# Patient Record
Sex: Male | Born: 1985
Health system: Southern US, Community
[De-identification: ages and names within clinical notes are randomized; demographics above are authoritative.]

---

## 2010-11-09 ENCOUNTER — Emergency Department: Payer: Self-pay | Admitting: Emergency Medicine

## 2014-06-23 ENCOUNTER — Ambulatory Visit: Payer: Self-pay | Admitting: General Practice

## 2015-10-06 ENCOUNTER — Other Ambulatory Visit: Payer: Self-pay | Admitting: Neurosurgery

## 2015-10-06 DIAGNOSIS — Q0700 Arnold-Chiari syndrome without spina bifida or hydrocephalus: Secondary | ICD-10-CM

## 2015-10-06 DIAGNOSIS — G950 Syringomyelia and syringobulbia: Secondary | ICD-10-CM

## 2015-10-15 ENCOUNTER — Ambulatory Visit
Admission: RE | Admit: 2015-10-15 | Discharge: 2015-10-15 | Disposition: A | Discharge status: Home / Self Care | Payer: 59 | Source: Ambulatory Visit | Attending: Neurosurgery | Admitting: Neurosurgery

## 2015-10-15 DIAGNOSIS — G950 Syringomyelia and syringobulbia: Secondary | ICD-10-CM | POA: Insufficient documentation

## 2015-10-15 DIAGNOSIS — Q0700 Arnold-Chiari syndrome without spina bifida or hydrocephalus: Secondary | ICD-10-CM | POA: Insufficient documentation

## 2015-10-15 DIAGNOSIS — G935 Compression of brain: Secondary | ICD-10-CM | POA: Diagnosis not present

## 2015-10-15 MED ORDER — GADOBENATE DIMEGLUMINE 529 MG/ML IV SOLN
20.0000 mL | Freq: Once | INTRAVENOUS | Status: AC | PRN
  Administered 2015-10-15: 20 mL via INTRAVENOUS

## 2015-10-16 ENCOUNTER — Other Ambulatory Visit: Payer: Self-pay

## 2015-10-17 ENCOUNTER — Ambulatory Visit
Admission: RE | Admit: 2015-10-17 | Discharge: 2015-10-17 | Disposition: A | Discharge status: Home / Self Care | Payer: 59 | Source: Ambulatory Visit | Attending: Neurosurgery | Admitting: Neurosurgery

## 2015-10-17 DIAGNOSIS — M4806 Spinal stenosis, lumbar region: Secondary | ICD-10-CM | POA: Diagnosis not present

## 2015-10-17 DIAGNOSIS — M5126 Other intervertebral disc displacement, lumbar region: Secondary | ICD-10-CM | POA: Insufficient documentation

## 2015-10-17 DIAGNOSIS — Q0700 Arnold-Chiari syndrome without spina bifida or hydrocephalus: Secondary | ICD-10-CM | POA: Diagnosis present

## 2015-10-17 DIAGNOSIS — G950 Syringomyelia and syringobulbia: Secondary | ICD-10-CM | POA: Insufficient documentation

## 2015-10-17 MED ORDER — GADOBENATE DIMEGLUMINE 529 MG/ML IV SOLN
20.0000 mL | Freq: Once | INTRAVENOUS | Status: AC | PRN
  Administered 2015-10-17: 20 mL via INTRAVENOUS

## 2016-01-08 DIAGNOSIS — Z13220 Encounter for screening for lipoid disorders: Secondary | ICD-10-CM | POA: Diagnosis not present

## 2016-01-08 DIAGNOSIS — G935 Compression of brain: Secondary | ICD-10-CM | POA: Diagnosis not present

## 2016-01-08 DIAGNOSIS — Z23 Encounter for immunization: Secondary | ICD-10-CM | POA: Diagnosis not present

## 2016-01-08 DIAGNOSIS — Z6830 Body mass index (BMI) 30.0-30.9, adult: Secondary | ICD-10-CM | POA: Diagnosis not present

## 2016-01-08 DIAGNOSIS — E669 Obesity, unspecified: Secondary | ICD-10-CM | POA: Diagnosis not present

## 2016-01-08 DIAGNOSIS — Z0000 Encounter for general adult medical examination without abnormal findings: Secondary | ICD-10-CM | POA: Diagnosis not present

## 2016-03-04 DIAGNOSIS — H52223 Regular astigmatism, bilateral: Secondary | ICD-10-CM | POA: Diagnosis not present

## 2018-06-11 ENCOUNTER — Encounter: Payer: Self-pay | Admitting: Adult Health

## 2018-06-11 ENCOUNTER — Ambulatory Visit (INDEPENDENT_AMBULATORY_CARE_PROVIDER_SITE_OTHER): Payer: 59 | Admitting: Adult Health

## 2018-06-11 DIAGNOSIS — Z0000 Encounter for general adult medical examination without abnormal findings: Secondary | ICD-10-CM | POA: Diagnosis not present

## 2018-06-11 DIAGNOSIS — I820 Budd-Chiari syndrome: Secondary | ICD-10-CM | POA: Diagnosis not present

## 2018-06-11 NOTE — Patient Instructions (Addendum)
Preventive Care for Adults, Male A healthy lifestyle and preventive care can promote health and wellness. Preventive health guidelines for men include the following key practices:  A routine yearly physical is a good way to check with your health care provider about your health and preventative screening. It is a chance to share any concerns and updates on your health and to receive a thorough exam.  Visit your dentist for a routine exam and preventative care every 6 months. Brush your teeth twice a day and floss once a day. Good oral hygiene prevents tooth decay and gum disease.  The frequency of eye exams is based on your age, health, family medical history, use of contact lenses, and other factors. Follow your health care provider's recommendations for frequency of eye exams.  Eat a healthy diet. Foods such as vegetables, fruits, whole grains, low-fat dairy products, and lean protein foods contain the nutrients you need without too many calories. Decrease your intake of foods high in solid fats, added sugars, and salt. Eat the right amount of calories for you.Get information about a proper diet from your health care provider, if necessary.  Regular physical exercise is one of the most important things you can do for your health. Most adults should get at least 150 minutes of moderate-intensity exercise (any activity that increases your heart rate and causes you to sweat) each week. In addition, most adults need muscle-strengthening exercises on 2 or more days a week.  Maintain a healthy weight. The body mass index (BMI) is a screening tool to identify possible weight problems. It provides an estimate of body fat based on height and weight. Your health care provider can find your BMI and can help you achieve or maintain a healthy weight.For adults 20 years and older:  A BMI below 18.5 is considered underweight.  A BMI of 18.5 to 24.9 is normal.  A BMI of 25 to 29.9 is considered  overweight.  A BMI of 30 and above is considered obese.  Maintain normal blood lipids and cholesterol levels by exercising and minimizing your intake of saturated fat. Eat a balanced diet with plenty of fruit and vegetables. Blood tests for lipids and cholesterol should begin at age 55 and be repeated every 5 years. If your lipid or cholesterol levels are high, you are over 50, or you are at high risk for heart disease, you may need your cholesterol levels checked more frequently.Ongoing high lipid and cholesterol levels should be treated with medicines if diet and exercise are not working.  If you smoke, find out from your health care provider how to quit. If you do not use tobacco, do not start.  Lung cancer screening is recommended for adults aged 90-80 years who are at high risk for developing lung cancer because of a history of smoking. A yearly low-dose CT scan of the lungs is recommended for people who have at least a 30-pack-year history of smoking and are a current smoker or have quit within the past 15 years. A pack year of smoking is smoking an average of 1 pack of cigarettes a day for 1 year (for example: 1 pack a day for 30 years or 2 packs a day for 15 years). Yearly screening should continue until the smoker has stopped smoking for at least 15 years. Yearly screening should be stopped for people who develop a health problem that would prevent them from having lung cancer treatment.  If you choose to drink alcohol, do not have more  than 2 drinks per day. One drink is considered to be 12 ounces (355 mL) of beer, 5 ounces (148 mL) of wine, or 1.5 ounces (44 mL) of liquor.  Avoid use of street drugs. Do not share needles with anyone. Ask for help if you need support or instructions about stopping the use of drugs.  High blood pressure causes heart disease and increases the risk of stroke. Your blood pressure should be checked at least every 1-2 years. Ongoing high blood pressure should be  treated with medicines, if weight loss and exercise are not effective.  If you are 34-90 years old, ask your health care provider if you should take aspirin to prevent heart disease.  Diabetes screening is done by taking a blood sample to check your blood glucose level after you have not eaten for a certain period of time (fasting). If you are not overweight and you do not have risk factors for diabetes, you should be screened once every 3 years starting at age 35. If you are overweight or obese and you are 70-84 years of age, you should be screened for diabetes every year as part of your cardiovascular risk assessment.  Colorectal cancer can be detected and often prevented. Most routine colorectal cancer screening begins at the age of 18 and continues through age 69. However, your health care provider may recommend screening at an earlier age if you have risk factors for colon cancer. On a yearly basis, your health care provider may provide home test kits to check for hidden blood in the stool. Use of a small camera at the end of a tube to directly examine the colon (sigmoidoscopy or colonoscopy) can detect the earliest forms of colorectal cancer. Talk to your health care provider about this at age 71, when routine screening begins. Direct exam of the colon should be repeated every 5-10 years through age 18, unless early forms of precancerous polyps or small growths are found.  People who are at an increased risk for hepatitis B should be screened for this virus. You are considered at high risk for hepatitis B if:  You were born in a country where hepatitis B occurs often. Talk with your health care provider about which countries are considered high risk.  Your parents were born in a high-risk country and you have not received a shot to protect against hepatitis B (hepatitis B vaccine).  You have HIV or AIDS.  You use needles to inject street drugs.  You live with, or have sex with, someone who  has hepatitis B.  You are a man who has sex with other men (MSM).  You get hemodialysis treatment.  You take certain medicines for conditions such as cancer, organ transplantation, and autoimmune conditions.  Hepatitis C blood testing is recommended for all people born from [birth year] through 1965 and any individual with known risks for hepatitis C.  Practice safe sex. Use condoms and avoid high-risk sexual practices to reduce the spread of sexually transmitted infections (STIs). STIs include gonorrhea, chlamydia, syphilis, trichomonas, herpes, HPV, and human immunodeficiency virus (HIV). Herpes, HIV, and HPV are viral illnesses that have no cure. They can result in disability, cancer, and death.  If you are a man who has sex with other men, you should be screened at least once per year for:  HIV.  Urethral, rectal, and pharyngeal infection of gonorrhea, chlamydia, or both.  If you are at risk of being infected with HIV, it is recommended that you take a  prescription medicine daily to prevent HIV infection. This is called preexposure prophylaxis (PrEP). You are considered at risk if:  You are a man who has sex with other men (MSM) and have other risk factors.  You are a heterosexual man, are sexually active, and are at increased risk for HIV infection.  You take drugs by injection.  You are sexually active with a partner who has HIV.  Talk with your health care provider about whether you are at high risk of being infected with HIV. If you choose to begin PrEP, you should first be tested for HIV. You should then be tested every 3 months for as long as you are taking PrEP.  A one-time screening for abdominal aortic aneurysm (AAA) and surgical repair of large AAAs by ultrasound are recommended for men ages 44 to 66 years who are current or former smokers.  Healthy men should no longer receive prostate-specific antigen (PSA) blood tests as part of routine cancer screening. Talk with your health  care provider about prostate cancer screening.  Testicular cancer screening is not recommended for adult males who have no symptoms. Screening includes self-exam, a health care provider exam, and other screening tests. Consult with your health care provider about any symptoms you have or any concerns you have about testicular cancer.  Use sunscreen. Apply sunscreen liberally and repeatedly throughout the day. You should seek shade when your shadow is shorter than you. Protect yourself by wearing long sleeves, pants, a wide-brimmed hat, and sunglasses year round, whenever you are outdoors.  Once a month, do a whole-body skin exam, using a mirror to look at the skin on your back. Tell your health care provider about new moles, moles that have irregular borders, moles that are larger than a pencil eraser, or moles that have changed in shape or color.  Stay current with required vaccines (immunizations).  Influenza vaccine. All adults should be immunized every year.  Tetanus, diphtheria, and acellular pertussis (Td, Tdap) vaccine. An adult who has not previously received Tdap or who does not know his vaccine status should receive 1 dose of Tdap. This initial dose should be followed by tetanus and diphtheria toxoids (Td) booster doses every 10 years. Adults with an unknown or incomplete history of completing a 3-dose immunization series with Td-containing vaccines should begin or complete a primary immunization series including a Tdap dose. Adults should receive a Td booster every 10 years.  Varicella vaccine. An adult without evidence of immunity to varicella should receive 2 doses or a second dose if he has previously received 1 dose.  Human papillomavirus (HPV) vaccine. Males aged 11-21 years who have not received the vaccine previously should receive the 3-dose series. Males aged 22-26 years may be immunized. Immunization is recommended through the age of 23 years for any male who has sex with males  and did not get any or all doses earlier. Immunization is recommended for any person with an immunocompromised condition through the age of 72 years if he did not get any or all doses earlier. During the 3-dose series, the second dose should be obtained 4-8 weeks after the first dose. The third dose should be obtained 24 weeks after the first dose and 16 weeks after the second dose.  Zoster vaccine. One dose is recommended for adults aged 23 years or older unless certain conditions are present.  Measles, mumps, and rubella (MMR) vaccine. Adults born before [birth year] generally are considered immune to measles and mumps. Adults born in [birth year]  or later should have 1 or more doses of MMR vaccine unless there is a contraindication to the vaccine or there is laboratory evidence of immunity to each of the three diseases. A routine second dose of MMR vaccine should be obtained at least 28 days after the first dose for students attending postsecondary schools, health care workers, or international travelers. People who received inactivated measles vaccine or an unknown type of measles vaccine during 1963-1967 should receive 2 doses of MMR vaccine. People who received inactivated mumps vaccine or an unknown type of mumps vaccine before 1979 and are at high risk for mumps infection should consider immunization with 2 doses of MMR vaccine. Unvaccinated health care workers born before [birth year] who lack laboratory evidence of measles, mumps, or rubella immunity or laboratory confirmation of disease should consider measles and mumps immunization with 2 doses of MMR vaccine or rubella immunization with 1 dose of MMR vaccine.  Pneumococcal 13-valent conjugate (PCV13) vaccine. When indicated, a person who is uncertain of his immunization history and has no record of immunization should receive the PCV13 vaccine. All adults 9 years of age and older should receive this vaccine. An adult aged 69 years or older who has certain medical  conditions and has not been previously immunized should receive 1 dose of PCV13 vaccine. This PCV13 should be followed with a dose of pneumococcal polysaccharide (PPSV23) vaccine. Adults who are at high risk for pneumococcal disease should obtain the PPSV23 vaccine at least 8 weeks after the dose of PCV13 vaccine. Adults older than 31 years of age who have normal immune system function should obtain the PPSV23 vaccine dose at least 1 year after the dose of PCV13 vaccine.  Pneumococcal polysaccharide (PPSV23) vaccine. When PCV13 is also indicated, PCV13 should be obtained first. All adults aged 79 years and older should be immunized. An adult younger than age 43 years who has certain medical conditions should be immunized. Any person who resides in a nursing home or long-term care facility should be immunized. An adult smoker should be immunized. People with an immunocompromised condition and certain other conditions should receive both PCV13 and PPSV23 vaccines. People with human immunodeficiency virus (HIV) infection should be immunized as soon as possible after diagnosis. Immunization during chemotherapy or radiation therapy should be avoided. Routine use of PPSV23 vaccine is not recommended for American Indians, [place] Natives, or people younger than 65 years unless there are medical conditions that require PPSV23 vaccine. When indicated, people who have unknown immunization and have no record of immunization should receive PPSV23 vaccine. One-time revaccination 5 years after the first dose of PPSV23 is recommended for people aged 19-64 years who have chronic kidney failure, nephrotic syndrome, asplenia, or immunocompromised conditions. People who received 1-2 doses of PPSV23 before age 70 years should receive another dose of PPSV23 vaccine at age 79 years or later if at least 5 years have passed since the previous dose. Doses of PPSV23 are not needed for people immunized with PPSV23 at or after age 55  years.  Meningococcal vaccine. Adults with asplenia or persistent complement component deficiencies should receive 2 doses of quadrivalent meningococcal conjugate (MenACWY-D) vaccine. The doses should be obtained at least 2 months apart. Microbiologists working with certain meningococcal bacteria, [employer] recruits, people at risk during an outbreak, and people who travel to or live in countries with a high rate of meningitis should be immunized. A first-year college student up through age 64 years who is living in a residence hall should receive a  dose if he did not receive a dose on or after his 16th birthday. Adults who have certain high-risk conditions should receive one or more doses of vaccine.  Hepatitis A vaccine. Adults who wish to be protected from this disease, have chronic liver disease, work with hepatitis A-infected animals, work in hepatitis A research labs, or travel to or work in countries with a high rate of hepatitis A should be immunized. Adults who were previously unvaccinated and who anticipate close contact with an international adoptee during the first 60 days after arrival in the [place] from a country with a high rate of hepatitis A should be immunized.  Hepatitis B vaccine. Adults should be immunized if they wish to be protected from this disease, are under age 34 years and have diabetes, have chronic liver disease, have had more than one sex partner in the past 6 months, may be exposed to blood or other infectious body fluids, are household contacts or sex partners of hepatitis B positive people, are clients or workers in certain care facilities, or travel to or work in countries with a high rate of hepatitis B.  Haemophilus influenzae type b (Hib) vaccine. A previously unvaccinated person with asplenia or sickle cell disease or having a scheduled splenectomy should receive 1 dose of Hib vaccine. Regardless of previous immunization, a recipient of a hematopoietic stem cell  transplant should receive a 3-dose series 6-12 months after his successful transplant. Hib vaccine is not recommended for adults with HIV infection. Preventive Service / Frequency Ages 77 to 55  Blood pressure check.** / Every 3-5 years.  Lipid and cholesterol check.** / Every 5 years beginning at age 66.  Hepatitis C blood test.** / For any individual with known risks for hepatitis C.  Skin self-exam. / Monthly.  Influenza vaccine. / Every year.  Tetanus, diphtheria, and acellular pertussis (Tdap, Td) vaccine.** / Consult your health care provider. 1 dose of Td every 10 years.  Varicella vaccine.** / Consult your health care provider.  HPV vaccine. / 3 doses over 6 months, if 45 or younger.  Measles, mumps, rubella (MMR) vaccine.** / You need at least 1 dose of MMR if you were born in 1957 or later. You may also need a second dose.  Pneumococcal 13-valent conjugate (PCV13) vaccine.** / Consult your health care provider.  Pneumococcal polysaccharide (PPSV23) vaccine.** / 1 to 2 doses if you smoke cigarettes or if you have certain conditions.  Meningococcal vaccine.** / 1 dose if you are age 81 to 79 years and a Market researcher living in a residence hall, or have one of several medical conditions. You may also need additional booster doses.  Hepatitis A vaccine.** / Consult your health care provider.  Hepatitis B vaccine.** / Consult your health care provider.  Haemophilus influenzae type b (Hib) vaccine.** / Consult your health care provider. Ages 6 to 58  Blood pressure check.** / Every year.  Lipid and cholesterol check.** / Every 5 years beginning at age 89.  Lung cancer screening. / Every year if you are aged 84-80 years and have a 30-pack-year history of smoking and currently smoke or have quit within the past 15 years. Yearly screening is stopped once you have quit smoking for at least 15 years or develop a health problem that would prevent you from having  lung cancer treatment.  Fecal occult blood test (FOBT) of stool. / Every year beginning at age 90 and continuing until age 73. You may not have to do  this test if you get a colonoscopy every 10 years.  Flexible sigmoidoscopy** or colonoscopy.** / Every 5 years for a flexible sigmoidoscopy or every 10 years for a colonoscopy beginning at age 50 and continuing until age 75.  Hepatitis C blood test.** / For all people born from 1945 through 1965 and any individual with known risks for hepatitis C.  Skin self-exam. / Monthly.  Influenza vaccine. / Every year.  Tetanus, diphtheria, and acellular pertussis (Tdap/Td) vaccine.** / Consult your health care provider. 1 dose of Td every 10 years.  Varicella vaccine.** / Consult your health care provider.  Zoster vaccine.** / 1 dose for adults aged 60 years or older.  Measles, mumps, rubella (MMR) vaccine.** / You need at least 1 dose of MMR if you were born in 1957 or later. You may also need a second dose.  Pneumococcal 13-valent conjugate (PCV13) vaccine.** / Consult your health care provider.  Pneumococcal polysaccharide (PPSV23) vaccine.** / 1 to 2 doses if you smoke cigarettes or if you have certain conditions.  Meningococcal vaccine.** / Consult your health care provider.  Hepatitis A vaccine.** / Consult your health care provider.  Hepatitis B vaccine.** / Consult your health care provider.  Haemophilus influenzae type b (Hib) vaccine.** / Consult your health care provider. Ages 65 and over  Blood pressure check.** / Every year.  Lipid and cholesterol check.**/ Every 5 years beginning at age 20.  Lung cancer screening. / Every year if you are aged 55-80 years and have a 30-pack-year history of smoking and currently smoke or have quit within the past 15 years. Yearly screening is stopped once you have quit smoking for at least 15 years or develop a health problem that would prevent you from having lung cancer treatment.  Fecal  occult blood test (FOBT) of stool. / Every year beginning at age 50 and continuing until age 75. You may not have to do this test if you get a colonoscopy every 10 years.  Flexible sigmoidoscopy** or colonoscopy.** / Every 5 years for a flexible sigmoidoscopy or every 10 years for a colonoscopy beginning at age 50 and continuing until age 75.  Hepatitis C blood test.** / For all people born from 1945 through 1965 and any individual with known risks for hepatitis C.  Abdominal aortic aneurysm (AAA) screening.** / A one-time screening for ages 65 to 75 years who are current or former smokers.  Skin self-exam. / Monthly.  Influenza vaccine. / Every year.  Tetanus, diphtheria, and acellular pertussis (Tdap/Td) vaccine.** / 1 dose of Td every 10 years.  Varicella vaccine.** / Consult your health care provider.  Zoster vaccine.** / 1 dose for adults aged 60 years or older.  Pneumococcal 13-valent conjugate (PCV13) vaccine.** / 1 dose for all adults aged 65 years and older.  Pneumococcal polysaccharide (PPSV23) vaccine.** / 1 dose for all adults aged 65 years and older.  Meningococcal vaccine.** / Consult your health care provider.  Hepatitis A vaccine.** / Consult your health care provider.  Hepatitis B vaccine.** / Consult your health care provider.  Haemophilus influenzae type b (Hib) vaccine.** / Consult your health care provider. **Family history and personal history of risk and conditions may change your health care provider's recommendations.   This information is not intended to replace advice given to you by your health care provider. Make sure you discuss any questions you have with your health care provider.   Document Released: 02/07/2002 Document Revised: 01/02/2015 Document Reviewed: 05/09/2011 Elsevier Interactive Patient Education 2016   [place] refers to food and lifestyle choices that are based on the traditions of  countries located on the [employer]. This way of eating has been shown to help prevent certain conditions and improve outcomes for people who have chronic diseases, like kidney disease and heart disease. What are tips for following this plan? Lifestyle  Cook and eat meals together with your family, when possible.  Drink enough fluid to keep your urine clear or pale yellow.  Be physically active every day. This includes: ? Aerobic exercise like running or swimming. ? Leisure activities like gardening, walking, or housework.  Get 7-8 hours of sleep each night.  If recommended by your health care provider, drink red wine in moderation. This means 1 glass a day for nonpregnant women and 2 glasses a day for men. A glass of wine equals 5 oz (150 mL). Reading food labels  Check the serving size of packaged foods. For foods such as rice and pasta, the serving size refers to the amount of cooked product, not dry.  Check the total fat in packaged foods. Avoid foods that have saturated fat or trans fats.  Check the ingredients list for added sugars, such as corn syrup. Shopping  At the grocery store, buy most of your food from the areas near the walls of the store. This includes: ? Fresh fruits and vegetables (produce). ? Grains, beans, nuts, and seeds. Some of these may be available in unpackaged forms or large amounts (in bulk). ? Fresh seafood. ? Poultry and eggs. ? Low-fat dairy products.  Buy whole ingredients instead of prepackaged foods.  Buy fresh fruits and vegetables in-season from local farmers markets.  Buy frozen fruits and vegetables in resealable bags.  If you do not have access to quality fresh seafood, buy precooked frozen shrimp or canned fish, such as tuna, salmon, or sardines.  Buy small amounts of raw or cooked vegetables, salads, or olives from the deli or salad bar at your store.  Stock your pantry so you always have certain foods on hand, such as olive  oil, canned tuna, canned tomatoes, rice, pasta, and beans. Cooking  Cook foods with extra-virgin olive oil instead of using butter or other vegetable oils.  Have meat as a side dish, and have vegetables or grains as your main dish. This means having meat in small portions or adding small amounts of meat to foods like pasta or stew.  Use beans or vegetables instead of meat in common dishes like chili or lasagna.  Experiment with different cooking methods. Try roasting or broiling vegetables instead of steaming or sauteing them.  Add frozen vegetables to soups, stews, pasta, or rice.  Add nuts or seeds for added healthy fat at each meal. You can add these to yogurt, salads, or vegetable dishes.  Marinate fish or vegetables using olive oil, lemon juice, garlic, and fresh herbs. Meal planning  Plan to eat 1 vegetarian meal one day each week. Try to work up to 2 vegetarian meals, if possible.  Eat seafood 2 or more times a week.  Have healthy snacks readily available, such as: ? Vegetable sticks with hummus. ? [place] yogurt. ? Fruit and nut trail mix.  Eat balanced meals throughout the week. This includes: ? Fruit: 2-3 servings a day ? Vegetables: 4-5 servings a day ? Low-fat dairy: 2 servings a day ? Fish, poultry, or lean meat: 1 serving a day ? Beans and legumes: 2 or more  servings a week ? Nuts and seeds: 1-2 servings a day ? Whole grains: 6-8 servings a day ? Extra-virgin olive oil: 3-4 servings a day  Limit red meat and sweets to only a few servings a month What are my food choices?  Mediterranean diet ? Recommended ? Grains: Whole-grain pasta. Brown rice. Bulgar wheat. Polenta. Couscous. Whole-wheat bread. [place]. ? Vegetables: Artichokes. Beets. Broccoli. Cabbage. Carrots. Eggplant. Green beans. Chard. Kale. Spinach. Onions. Leeks. Peas. Squash. Tomatoes. Peppers. Radishes. ? Fruits: Apples. Apricots. Avocado. Berries. Bananas. Cherries. Dates. Figs. Grapes.  Lemons. Melon. Oranges. Peaches. Plums. Pomegranate. ? Meats and other protein foods: Beans. Almonds. Sunflower seeds. Pine nuts. Peanuts. [place]. Salmon. Scallops. Shrimp. [place]. Tilapia. Clams. Oysters. Eggs. ? Dairy: Low-fat milk. Cheese. Greek yogurt. ? Beverages: Water. Red wine. Herbal tea. ? Fats and oils: Extra virgin olive oil. Avocado oil. Grape seed oil. ? Sweets and desserts: [place] yogurt with honey. Baked apples. Poached pears. Trail mix. ? Seasoning and other foods: Basil. Cilantro. Coriander. Cumin. Mint. Parsley. Sage. Rosemary. Tarragon. Garlic. Oregano. Thyme. Pepper. Balsalmic vinegar. Tahini. Hummus. Tomato sauce. Olives. Mushrooms. ? Limit these ? Grains: Prepackaged pasta or rice dishes. Prepackaged cereal with added sugar. ? Vegetables: Deep fried potatoes (french fries). ? Fruits: Fruit canned in syrup. ? Meats and other protein foods: Beef. Pork. Lamb. Poultry with skin. Hot dogs. Bernie[place] Salines. ? Dairy: Ice cream. Sour cream. Whole milk. ? Beverages: Juice. Sugar-sweetened soft drinks. Beer. Liquor and spirits. ? Fats and oils: Butter. Canola oil. Vegetable oil. Beef fat (tallow). Lard. ? Sweets and desserts: Cookies. Cakes. Pies. Candy. ? Seasoning and other foods: Mayonnaise. Premade sauces and marinades. ? The items listed may not be a complete list. Talk with your dietitian about what dietary choices are right for you. Summary  The Mediterranean diet includes both food and lifestyle choices.  Eat a variety of fresh fruits and vegetables, beans, nuts, seeds, and whole grains.  Limit the amount of red meat and sweets that you eat.  Talk with your health care provider about whether it is safe for you to drink red wine in moderation. This means 1 glass a day for nonpregnant women and 2 glasses a day for men. A glass of wine equals 5 oz (150 mL). This information is not intended to replace advice given to you by your health care provider. Make sure you discuss any questions  you have with your health care provider. Document Released: 08/04/2016 Document Revised: 09/06/2016 Document Reviewed: 08/04/2016 Elsevier Interactive Patient Education  2018 [employer].   Increase regular exercise.  Recommend at least 30 minutes daily, 5 days per week of walking, jogging, biking, swimming, YouTube/Pinterest workout videos. We will you when your lab results are available. Continue with [facility] Neurologist as directed. If you are interested in re-starting phentermine, please make office appt. Recommend annual physical with fasting labs.

## 2018-06-11 NOTE — Progress Notes (Signed)
Subjective:    Patient ID: Phillip Sheppard, male    DOB: 05/06/1986, 32 y.o.   MRN: [id number]  HPI :  [name] is here to establish as a new pt.  He is a pleasant 32 year old male.  PMH: Chiari Syndrome- [facility], he denies current neurological sx's. He reports drinking >gallon water/day and has been consuming a diet rich in saturated fat/CHO since the arrival of his son Phillip Sheppard (he is 9310 months old) He denies acute neurological sx's rt chiari syndrome He exercises twice week- elliptical and weight training. He denies tobacco/ETOH use His wife is a Engineer, civil (consulting)nurse that works at [employer]   Patient Care Team    Relationship Specialty Notifications Start End  Julaine Fusianford, Kaysie Michelini D, NP PCP - General Family Medicine  06/11/18     Patient Active Problem List   Diagnosis Date Noted  . Healthcare maintenance 06/11/2018  . 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     No family history on file.   Social History   Substance and Sexual Activity  Drug Use Never     Social History   Substance and Sexual Activity  Alcohol Use Never  . Frequency: Never     Social History   Tobacco Use  Smoking Status Never Smoker  Smokeless Tobacco Never Used     No outpatient encounter medications on file as of 06/11/2018.   No facility-administered encounter medications on file as of 06/11/2018.     Allergies: Patient has no allergy information on record.  Body mass index is 34.12 kg/m.  Blood pressure 117/70, pulse 72, height 6\' 3"  (1.905 m), weight 273 lb (123.8 kg), SpO2 96 %.  Review of Systems  Constitutional: Positive for fatigue. Negative for activity change, appetite change, chills, diaphoresis, fever and unexpected weight change.  HENT: Negative for congestion.   Eyes: Negative for visual disturbance.  Respiratory: Negative for cough, chest tightness, shortness of breath, wheezing and stridor.   Cardiovascular:  Negative for chest pain, palpitations and leg swelling.  Gastrointestinal: Negative for abdominal distention, abdominal pain, blood in stool, constipation, diarrhea, nausea and vomiting.  Endocrine: Negative for cold intolerance, heat intolerance, polydipsia, polyphagia and polyuria.  Genitourinary: Negative for difficulty urinating, flank pain, frequency and hematuria.  Musculoskeletal: Negative for arthralgias, back pain, gait problem, joint swelling, myalgias, neck pain and neck stiffness.  Skin: Negative for color change, pallor, rash and wound.  Neurological: Negative for dizziness, tremors, weakness, light-headedness and headaches.  Hematological: Does not bruise/bleed easily.  Psychiatric/Behavioral: Negative for confusion, decreased concentration, dysphoric mood, hallucinations, self-injury, sleep disturbance and suicidal ideas. The patient is not nervous/anxious and is not hyperactive.        Objective:   Physical Exam  Constitutional: He is oriented to person, place, and time. He appears well-developed and well-nourished. No distress.  HENT:  Head: Normocephalic and atraumatic.  Right Ear: Hearing, tympanic membrane, external ear and ear canal normal. Tympanic membrane is not erythematous and not bulging. No decreased hearing is noted.  Left Ear: Hearing, tympanic membrane, external ear and ear canal normal. Tympanic membrane is not erythematous and not bulging. No decreased hearing is noted.  Mouth/Throat: Oropharynx is clear and moist.  Eyes: Pupils are equal, round, and reactive to light. Conjunctivae, EOM and lids are normal.  Neck: Trachea normal, normal range of motion and full passive range of motion without pain.  Cardiovascular: Normal rate, regular rhythm and normal heart  sounds.  No murmur heard. Pulmonary/Chest: Effort normal and breath sounds normal. No stridor. No respiratory distress. He has no wheezes. He has no rales. He exhibits no tenderness.  Abdominal: Soft.  Bowel sounds are normal. He exhibits no distension and no mass. There is no tenderness. There is no rebound and no guarding. No hernia.  Musculoskeletal: Normal range of motion. He exhibits no edema.  Neurological: He is alert and oriented to person, place, and time. Coordination normal.  Skin: Skin is warm and dry. Capillary refill takes less than 2 seconds. No rash noted. He is not diaphoretic. No erythema. No pallor.  Psychiatric: He has a normal mood and affect. His behavior is normal. Judgment and thought content normal.  Nursing note and vitals reviewed.     Assessment & Plan:   1. Healthcare maintenance   2. Chiaris syndrome (HCC)     Chiaris syndrome (HCC) He denies acute Neurological sx's Followed by [facility] Q[facility]  Healthcare maintenance Increase regular exercise.  Recommend at least 30 minutes daily, 5 days per week of walking, jogging, biking, swimming, YouTube/Pinterest workout videos. We will you when your lab results are available. Continue with [facility]ist as directed. If you are interested in re-starting phentermine, please make office appt. Recommend annual physical with fasting labs.  FOLLOW-UP:  Return in about 1 year (around 06/12/2019) for CPE, Fasting Labs.

## 2018-06-11 NOTE — Assessment & Plan Note (Signed)
Increase regular exercise.  Recommend at least 30 minutes daily, 5 days per week of walking, jogging, biking, swimming, YouTube/Pinterest workout videos. We will you when your lab results are available. [facility] as directed. If you are interested in re-starting phentermine, please make office appt. Recommend annual physical with fasting labs.

## 2018-06-11 NOTE — Assessment & Plan Note (Signed)
He denies acute Neurological sx's [facility]

## 2018-06-12 LAB — COMPREHENSIVE METABOLIC PANEL
A/G RATIO: 1.7 (ref 1.2–2.2)
ALBUMIN: 4.5 g/dL (ref 3.5–5.5)
ALT: 30 IU/L (ref 0–44)
AST: 40 IU/L (ref 0–40)
Alkaline Phosphatase: 66 IU/L (ref 39–117)
BILIRUBIN TOTAL: 0.5 mg/dL (ref 0.0–1.2)
BUN / CREAT RATIO: 11 (ref 9–20)
BUN: 10 mg/dL (ref 6–20)
CALCIUM: 9.3 mg/dL (ref 8.7–10.2)
CHLORIDE: 104 mmol/L (ref 96–106)
CO2: 24 mmol/L (ref 20–29)
Creatinine, Ser: 0.87 mg/dL (ref 0.76–1.27)
GFR, EST AFRICAN AMERICAN: 134 mL/min/{1.73_m2} (ref >59)
GFR, EST NON AFRICAN AMERICAN: 116 mL/min/{1.73_m2} (ref >59)
GLOBULIN, TOTAL: 2.7 g/dL (ref 1.5–4.5)
Glucose: 79 mg/dL (ref 65–99)
Potassium: 5.3 mmol/L — ABNORMAL HIGH (ref 3.5–5.2)
SODIUM: 142 mmol/L (ref 134–144)
TOTAL PROTEIN: 7.2 g/dL (ref 6.0–8.5)

## 2018-06-12 LAB — LIPID PANEL
CHOL/HDL RATIO: 2.8 ratio (ref 0.0–5.0)
Cholesterol, Total: 140 mg/dL (ref 100–199)
HDL: 50 mg/dL (ref >39)
LDL Calculated: 72 mg/dL (ref 0–99)
Triglycerides: 90 mg/dL (ref 0–149)
VLDL Cholesterol Cal: 18 mg/dL (ref 5–40)

## 2018-06-12 LAB — CBC WITH DIFFERENTIAL/PLATELET
Basophils Absolute: 0 10*3/uL (ref 0.0–0.2)
Basos: 0 %
EOS (ABSOLUTE): 0.1 10*3/uL (ref 0.0–0.4)
EOS: 1 %
HEMATOCRIT: 45.5 % (ref 37.5–51.0)
Hemoglobin: 16 g/dL (ref 13.0–17.7)
IMMATURE GRANULOCYTES: 0 %
Immature Grans (Abs): 0 10*3/uL (ref 0.0–0.1)
LYMPHS ABS: 1.6 10*3/uL (ref 0.7–3.1)
Lymphs: 26 %
MCH: 29.3 pg (ref 26.6–33.0)
MCHC: 35.2 g/dL (ref 31.5–35.7)
MCV: 83 fL (ref 79–97)
MONOS ABS: 0.4 10*3/uL (ref 0.1–0.9)
Monocytes: 6 %
NEUTROS PCT: 67 %
Neutrophils Absolute: 4.1 10*3/uL (ref 1.4–7.0)
PLATELETS: 211 10*3/uL (ref 150–450)
RBC: 5.46 x10E6/uL (ref 4.14–5.80)
RDW: 13.9 % (ref 12.3–15.4)
WBC: 6.1 10*3/uL (ref 3.4–10.8)

## 2018-06-12 LAB — TSH: TSH: 0.943 u[IU]/mL (ref 0.450–4.500)

## 2018-06-12 LAB — HEMOGLOBIN A1C
ESTIMATED AVERAGE GLUCOSE: 105 mg/dL
HEMOGLOBIN A1C: 5.3 % (ref 4.8–5.6)

## 2018-07-10 ENCOUNTER — Encounter: Payer: Self-pay | Admitting: Adult Health

## 2018-07-10 ENCOUNTER — Other Ambulatory Visit: Payer: 59

## 2018-07-10 ENCOUNTER — Ambulatory Visit (INDEPENDENT_AMBULATORY_CARE_PROVIDER_SITE_OTHER): Payer: 59 | Admitting: Adult Health

## 2018-07-10 DIAGNOSIS — E875 Hyperkalemia: Secondary | ICD-10-CM

## 2018-07-10 NOTE — Progress Notes (Signed)
OV changes to Lab draw only

## 2018-07-10 NOTE — Progress Notes (Deleted)
Subjective:    Patient ID: Phillip Sheppard, male    DOB: October 28, 1986, 32 y.o.   MRN: [id number]  HPI : 06/11/18 OV:  Phillip Sheppard is here to establish as a new pt.  He is a pleasant 32 year old male.  PMH: Chiari Syndrome- followed by [facility] Q2[facility], he denies current neurological sx's. He reports drinking >gallon water/day and has been consuming a diet rich in saturated fat/CHO since the arrival of his son Phillip Sheppard (he is 65 months old) He denies acute neurological sx's rt chiari syndrome He exercises twice week- elliptical and weight training. He denies tobacco/ETOH use His wife is a Engineer, civil (consulting) that works at [employer]   07/10/18 OV: Phillip Sheppard is here for CPE  Reviewed all recent lab results  Healthcare Maintenance: Colonoscopy-not indicated Immunizations-   Patient Care Team    Relationship Specialty Notifications Start End  Julaine Fusi, NP PCP - General Family Medicine  06/11/18     Patient Active Problem List   Diagnosis Date Noted  . Healthcare maintenance 06/11/2018  . 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     No family history on file.   Social History   Substance and Sexual Activity  Drug Use Never     Social History   Substance and Sexual Activity  Alcohol Use Never  . Frequency: Never     Social History   Tobacco Use  Smoking Status Never Smoker  Smokeless Tobacco Never Used     No outpatient encounter medications on file as of 07/10/2018.   No facility-administered encounter medications on file as of 07/10/2018.     Allergies: Patient has no allergy information on record.  There is no height or weight on file to calculate BMI.  There were no vitals taken for this visit.  Review of Systems  Constitutional: Positive for fatigue. Negative for activity change, appetite change, chills, diaphoresis, fever and unexpected weight change.  HENT: Negative for congestion.   Eyes: Negative for visual  disturbance.  Respiratory: Negative for cough, chest tightness, shortness of breath, wheezing and stridor.   Cardiovascular: Negative for chest pain, palpitations and leg swelling.  Gastrointestinal: Negative for abdominal distention, abdominal pain, blood in stool, constipation, diarrhea, nausea and vomiting.  Endocrine: Negative for cold intolerance, heat intolerance, polydipsia, polyphagia and polyuria.  Genitourinary: Negative for difficulty urinating, flank pain, frequency and hematuria.  Musculoskeletal: Negative for arthralgias, back pain, gait problem, joint swelling, myalgias, neck pain and neck stiffness.  Skin: Negative for color change, pallor, rash and wound.  Neurological: Negative for dizziness, tremors, weakness, light-headedness and headaches.  Hematological: Does not bruise/bleed easily.  Psychiatric/Behavioral: Negative for confusion, decreased concentration, dysphoric mood, hallucinations, self-injury, sleep disturbance and suicidal ideas. The patient is not nervous/anxious and is not hyperactive.        Objective:   Physical Exam  Constitutional: He is oriented to person, place, and time. He appears well-developed and well-nourished. No distress.  HENT:  Head: Normocephalic and atraumatic.  Right Ear: Hearing, tympanic membrane, external ear and ear canal normal. Tympanic membrane is not erythematous and not bulging. No decreased hearing is noted.  Left Ear: Hearing, tympanic membrane, external ear and ear canal normal. Tympanic membrane is not erythematous and not bulging. No decreased hearing is noted.  Mouth/Throat: Oropharynx is clear and moist.  Eyes: Pupils are equal, round, and reactive to light. Conjunctivae, EOM and lids are normal.  Neck: Trachea normal, normal range  of motion and full passive range of motion without pain.  Cardiovascular: Normal rate, regular rhythm and normal heart sounds.  No murmur heard. Pulmonary/Chest: Effort normal and breath sounds  normal. No stridor. No respiratory distress. He has no wheezes. He has no rales. He exhibits no tenderness.  Abdominal: Soft. Bowel sounds are normal. He exhibits no distension and no mass. There is no tenderness. There is no rebound and no guarding. No hernia.  Musculoskeletal: Normal range of motion. He exhibits no edema.  Neurological: He is alert and oriented to person, place, and time. Coordination normal.  Skin: Skin is warm and dry. Capillary refill takes less than 2 seconds. No rash noted. He is not diaphoretic. No erythema. No pallor.  Psychiatric: He has a normal mood and affect. His behavior is normal. Judgment and thought content normal.  Nursing note and vitals reviewed.     Assessment & Plan:   No diagnosis found.  No problem-specific Assessment & Plan notes found for this encounter.  FOLLOW-UP:  No follow-ups on file.

## 2018-07-11 LAB — POTASSIUM: POTASSIUM: 4.5 mmol/L (ref 3.5–5.2)

## 2018-08-29 DIAGNOSIS — H52223 Regular astigmatism, bilateral: Secondary | ICD-10-CM | POA: Diagnosis not present

## 2018-11-28 ENCOUNTER — Encounter: Payer: Self-pay | Admitting: Adult Health

## 2018-11-28 ENCOUNTER — Ambulatory Visit (INDEPENDENT_AMBULATORY_CARE_PROVIDER_SITE_OTHER): Payer: 59 | Admitting: Adult Health

## 2018-11-28 VITALS — BP 115/78 | HR 71 | Temp 98.5°F | Ht 75.0 in | Wt 271.4 lb

## 2018-11-28 DIAGNOSIS — Z0000 Encounter for general adult medical examination without abnormal findings: Secondary | ICD-10-CM | POA: Diagnosis not present

## 2018-11-28 DIAGNOSIS — J029 Acute pharyngitis, unspecified: Secondary | ICD-10-CM

## 2018-11-28 LAB — POCT RAPID STREP A (OFFICE): Rapid Strep A Screen: NEGATIVE

## 2018-11-28 MED ORDER — PREDNISONE 20 MG PO TABS: ORAL_TABLET | ORAL | 0 refills | Status: DC

## 2018-11-28 MED ORDER — FLUTICASONE PROPIONATE 50 MCG/ACT NA SUSP: 2.0000 | Freq: Every day | NASAL | 1 refills | Status: DC

## 2018-11-28 NOTE — Patient Instructions (Addendum)

## 2018-11-28 NOTE — Assessment & Plan Note (Signed)
  Strep Test Negative Continue to drink plenty of fluids and alternate OTC Acetaminophen and OTC Ibuprofen as needed. Take Prednisone as directed and Flonase as needed. Please call clinic with questions/concerns.

## 2018-11-28 NOTE — Progress Notes (Signed)
Subjective:    Patient ID: Phillip Sheppard, male    DOB: 1986/06/07, 32 y.o.   MRN: [id number]  HPI:  [name] presents with post nasal gtt and sore throat (6/10, described as "rocks in back of my throat) that started 2 days ago.  He reports "maybe having a fever and chills last night", however did not take his temp. He reports having a non-productive cough, denies N/V/D/CP/dyspnea/dizziness/palpitations. He denies being exposed to anyone with known strep. He denies re-current strep infections He has been taking OTC Robitussion and Mucinex, no medication this am. Temp today 98.5 f  Patient Care Team    Relationship Specialty Notifications Start End  Julaine Fusi, NP PCP - General Family Medicine  06/11/18     Patient Active Problem List   Diagnosis Date Noted  . Healthcare maintenance 06/11/2018  . 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 No pertinent past medical history.   History reviewed. No pertinent surgical history.   History reviewed. No pertinent family history.   Social History   Substance and Sexual Activity  Drug Use Never     Social History   Substance and Sexual Activity  Alcohol Use Never  . Frequency: Never     Social History   Tobacco Use  Smoking Status Never Smoker  Smokeless Tobacco Never Used     No outpatient encounter medications on file as of 11/28/2018.   No facility-administered encounter medications on file as of 11/28/2018.     Allergies: Patient has no allergy information on record.  Body mass index is 33.92 kg/m.  Blood pressure 115/78, pulse 71, temperature 98.5 F (36.9 C), temperature source Oral, height 6\' 3"  (1.905 m), weight 271 lb 6.4 oz (123.1 kg), SpO2 96 %.  Review of Systems  Constitutional: Positive for fatigue. Negative for activity change, appetite change, chills, diaphoresis, fever and unexpected weight change.  HENT: Positive for postnasal drip, sore throat and voice change. Negative  for congestion and trouble swallowing.   Eyes: Negative for visual disturbance.  Respiratory: Positive for cough. Negative for chest tightness, wheezing and stridor.   Cardiovascular: Negative for chest pain, palpitations and leg swelling.  Gastrointestinal: Negative for abdominal distention, anal bleeding, blood in stool, constipation, diarrhea, nausea and vomiting.  Neurological: Negative for dizziness and headaches.       Objective:   Physical Exam  Constitutional: He appears well-developed and well-nourished.  Non-toxic appearance. He does not appear ill. No distress.  HENT:  Head: Normocephalic and atraumatic.  Right Ear: Tympanic membrane is not erythematous and not bulging. No decreased hearing is noted.  Left Ear: Tympanic membrane is not erythematous and not bulging. No decreased hearing is noted.  Nose: Mucosal edema and rhinorrhea present. Right sinus exhibits no maxillary sinus tenderness and no frontal sinus tenderness. Left sinus exhibits no maxillary sinus tenderness and no frontal sinus tenderness.  Mouth/Throat: Uvula is midline and mucous membranes are normal. Posterior oropharyngeal edema and posterior oropharyngeal erythema present. No oropharyngeal exudate or tonsillar abscesses. Tonsils are 1+ on the right. Tonsils are 0 on the left. No tonsillar exudate.  Eyes: Pupils are equal, round, and reactive to light. EOM are normal.  Neck: Normal range of motion. Neck supple. No Kernig's sign noted.  Cardiovascular: Normal rate, regular rhythm, normal heart sounds and intact distal pulses.  Pulmonary/Chest: Effort normal and breath sounds normal.      Assessment & Plan:   1. Pharyngitis, unspecified etiology   2. Healthcare maintenance  Healthcare maintenance  Strep Test Negative Continue to drink plenty of fluids and alternate OTC Acetaminophen and OTC Ibuprofen as needed. Take Prednisone as directed and Flonase as needed. Please call clinic with  questions/concerns.    FOLLOW-UP:  Return if symptoms worsen or fail to improve.

## 2019-01-24 ENCOUNTER — Ambulatory Visit
Admission: EM | Admit: 2019-01-24 | Discharge: 2019-01-24 | Disposition: A | Discharge status: Home / Self Care | Payer: 59 | Attending: Family Medicine | Admitting: Family Medicine

## 2019-01-24 ENCOUNTER — Encounter: Payer: Self-pay | Admitting: Emergency Medicine

## 2019-01-24 DIAGNOSIS — B9789 Other viral agents as the cause of diseases classified elsewhere: Secondary | ICD-10-CM

## 2019-01-24 DIAGNOSIS — J069 Acute upper respiratory infection, unspecified: Secondary | ICD-10-CM | POA: Diagnosis not present

## 2019-01-24 NOTE — ED Provider Notes (Signed)
[facility]    CSN: [id number] Arrival date & time: 01/24/19  1704     History   Chief Complaint Chief Complaint  Patient presents with  . URI    HPI Phillip Sheppard is a 33 y.o. male no significant past medical history, Patient is presenting with URI symptoms- congestion, cough, sore throat. Patient's main complaints are concern for exposure to his [age]-year-old. Symptoms have been going on for 24 hours. Patient has tried Tylenol, with minimal relief. Denies fever, nausea, vomiting, diarrhea. Denies shortness of breath and chest pain.  Exposed to flu on Monday from his niece.  He is not consistently around her at home.   HPI  History reviewed. No pertinent past medical history.  Patient Active Problem List   Diagnosis Date Noted  . Pharyngitis 11/28/2018  . Healthcare maintenance 06/11/2018  . 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 No pertinent surgical history.     Home Medications    Prior to Admission medications   Medication Sig Start Date End Date Taking? Authorizing Provider  fluticasone (FLONASE) 50 MCG/ACT nasal spray Place 2 sprays into both nostrils daily. 11/28/18   Julaine Fusianford, Katy D, NP    Family History History reviewed. No pertinent family history.  Social History Social History   Tobacco Use  . Smoking status: Never Smoker  . Smokeless tobacco: Never Used  Substance Use Topics  . Alcohol use: Never    Frequency: Never  . Drug use: Never     Allergies   Patient has no allergy information on record.   Review of Systems Review of Systems  Constitutional: Negative for activity change, appetite change, chills, fatigue and fever.  HENT: Positive for congestion, rhinorrhea and sore throat. Negative for ear pain, sinus pressure and trouble swallowing.   Eyes: Negative for discharge and redness.  Respiratory: Positive for cough. Negative for chest tightness and shortness of breath.   Cardiovascular: Negative for chest  pain.  Gastrointestinal: Negative for abdominal pain, diarrhea, nausea and vomiting.  Musculoskeletal: Negative for myalgias.  Skin: Negative for rash.  Neurological: Positive for headaches. Negative for dizziness and light-headedness.     Physical Exam Triage Vital Signs ED Triage Vitals  Enc Vitals Group     BP 01/24/19 1712 (!) 151/88     Pulse Rate 01/24/19 1712 85     Resp 01/24/19 1712 18     Temp 01/24/19 1712 97.9 F (36.6 C)     Temp src --      SpO2 01/24/19 1712 95 %     Weight --      Height --      Head Circumference --      Peak Flow --      Pain Score 01/24/19 1713 5     Pain Loc --      Pain Edu? --      Excl. in GC? --    No data found.  Updated Vital Signs BP (!) 151/88 (BP Location: Left Arm)   Pulse 85   Temp 97.9 F (36.6 C)   Resp 18   SpO2 95%   Visual Acuity Right Eye Distance:   Left Eye Distance:   Bilateral Distance:    Right Eye Near:   Left Eye Near:    Bilateral Near:     Physical Exam Vitals signs and nursing note reviewed.  Constitutional:      Appearance: He is well-developed.  HENT:     Head: Normocephalic and atraumatic.  Ears:     Comments: Bilateral ears without tenderness to palpation of external auricle, tragus and mastoid, EAC's without erythema or swelling, TM's with good bony landmarks and cone of light. Non erythematous.    Nose:     Comments: Nasal mucosa pink, nonswollen turbinates    Mouth/Throat:     Comments: Oral mucosa pink and moist, no tonsillar enlargement or exudate. Posterior pharynx patent and nonerythematous, no uvula deviation or swelling. Normal phonation. Eyes:     Conjunctiva/sclera: Conjunctivae normal.  Neck:     Musculoskeletal: Neck supple.  Cardiovascular:     Rate and Rhythm: Normal rate and regular rhythm.     Heart sounds: No murmur.  Pulmonary:     Effort: Pulmonary effort is normal. No respiratory distress.     Breath sounds: Normal breath sounds.     Comments: Breathing  comfortably at rest, CTABL, no wheezing, rales or other adventitious sounds auscultated  Abdominal:     Palpations: Abdomen is soft.     Tenderness: There is no abdominal tenderness.  Skin:    General: Skin is warm and dry.  Neurological:     Mental Status: He is alert.      UC Treatments / Results  Labs (all labs ordered are listed, but only abnormal results are displayed) Labs Reviewed - No data to display  EKG None  Radiology No results found.  Procedures Procedures (including critical care time)  Medications Ordered in UC Medications - No data to display  Initial Impression / Assessment and Plan / UC Course  I have reviewed the triage vital signs and the nursing notes.  Pertinent labs & imaging results that were available during my care of the patient were reviewed by me and considered in my medical decision making (see chart for details).     Vital signs stable, URI symptoms 24 hrs., no fever, exam unremarkable, most likely viral etiology.  Less likely flu at this time, but if patient develops fever discussed that it could possibly present is more flu later on.  Recommending symptomatic and supportive care at this time.  Discussed good hand hygiene and covering cough/respiratory secretions in order to prevent transmission to son.  Continue to monitor,Discussed strict return precautions. Patient verbalized understanding and is agreeable with plan.  Final Clinical Impressions(s) / UC Diagnoses   Final diagnoses:  Viral URI with cough     Discharge Instructions     You likely having a viral upper respiratory infection. We recommended symptom control. I expect your symptoms to start improving in the next 1-2 weeks.   1. Take a daily allergy pill/anti-histamine like Zyrtec, Claritin, or Store brand consistently for 2 weeks  2. For congestion you may try an oral decongestant like Mucinex or sudafed. You may also try intranasal flonase nasal spray or saline  irrigations (neti pot, sinus cleanse)  3. For your sore throat you may try cepacol lozenges, salt water gargles, throat spray. Treatment of congestion may also help your sore throat.  4. For cough you may try Robitussen, Mucinex DM  5. Take Tylenol or Ibuprofen to help with pain/inflammation  6. Stay hydrated, drink plenty of fluids to keep throat coated and less irritated  Honey Tea For cough/sore throat try using a honey-based tea. Use 3 teaspoons of honey with juice squeezed from half lemon. Place shaved pieces of ginger into 1/2-1 cup of water and warm over stove top. Then mix the ingredients and repeat every 4 hours as needed.   ED  Prescriptions    None     Controlled Substance Prescriptions [place] Controlled Substance Registry consulted? Not Applicable   Lew Dawes, [place] 01/24/19 1728

## 2019-01-24 NOTE — ED Triage Notes (Signed)
Pt presents to [facility] for assessment of cough and headache with exposure to the flu on Monday night.

## 2019-01-24 NOTE — Discharge Instructions (Signed)
You likely having a viral upper respiratory infection. We recommended symptom control. I expect your symptoms to start improving in the next 1-2 weeks.  ° °1. Take a daily allergy pill/anti-histamine like Zyrtec, Claritin, or Store brand consistently for 2 weeks ° °2. For congestion you may try an oral decongestant like Mucinex or sudafed. You may also try intranasal flonase nasal spray or saline irrigations (neti pot, sinus cleanse) ° °3. For your sore throat you may try cepacol lozenges, salt water gargles, throat spray. Treatment of congestion may also help your sore throat. ° °4. For cough you may try Robitussen, Mucinex DM ° °5. Take Tylenol or Ibuprofen to help with pain/inflammation ° °6. Stay hydrated, drink plenty of fluids to keep throat coated and less irritated ° °Honey Tea °For cough/sore throat try using a honey-based tea. Use 3 teaspoons of honey with juice squeezed from half lemon. Place shaved pieces of ginger into 1/2-1 cup of water and warm over stove top. Then mix the ingredients and repeat every 4 hours as needed. °

## 2019-01-24 NOTE — ED Notes (Signed)
Patient able to ambulate independently  

## 2019-01-25 ENCOUNTER — Ambulatory Visit
Admission: EM | Admit: 2019-01-25 | Discharge: 2019-01-25 | Disposition: A | Discharge status: Home / Self Care | Payer: 59 | Attending: Family Medicine | Admitting: Family Medicine

## 2019-01-25 DIAGNOSIS — J101 Influenza due to other identified influenza virus with other respiratory manifestations: Secondary | ICD-10-CM

## 2019-01-25 LAB — POCT INFLUENZA A/B
INFLUENZA A, POC: POSITIVE — AB
INFLUENZA B, POC: NEGATIVE

## 2019-01-25 MED ORDER — OSELTAMIVIR PHOSPHATE 75 MG PO CAPS: 75.0000 mg | ORAL_CAPSULE | Freq: Two times a day (BID) | ORAL | 0 refills | Status: DC

## 2019-01-25 NOTE — ED Triage Notes (Signed)
Pt c/o cough, fever, body ache

## 2019-01-25 NOTE — ED Provider Notes (Addendum)
[facility]   [id number] 01/25/19 Arrival Time: 1436   CC: flu like symptoms   SUBJECTIVE: History from: patient.  Earley FavorJohnathan Eick is a 33 y.o. male who presents with abrupt onset of nasal congestion, runny nose, cough, body aches, fever, and chills x 2 days.  Tmax of 102 at home.  Admits to positive sick exposure to relative with the flu.  Has tried OTC medications with temporary relief.  [facility] and treated for viral uri with conservative treatment.  Denies SOB, wheezing, chest pain, nausea, changes in bowel or bladder habits.    Received flu shot this year: no.  ROS: As per HPI.  History reviewed. No pertinent past medical history. History reviewed. No pertinent surgical history. No Known Allergies No current facility-administered medications on file prior to encounter.    Current Outpatient Medications on File Prior to Encounter  Medication Sig Dispense Refill  . fluticasone (FLONASE) 50 MCG/ACT nasal spray Place 2 sprays into both nostrils daily. 16 g 1   Social History   Socioeconomic History  . Marital status: Married    Spouse name: Not on file  . Number of children: Not on file  . Years of education: Not on file  . Highest education level: Not on file  Occupational History  . Not on file  Social Needs  . Financial resource strain: Not on file  . Food insecurity:    Worry: Not on file    Inability: Not on file  . Transportation needs:    Medical: Not on file    Non-medical: Not on file  Tobacco Use  . Smoking status: Never Smoker  . Smokeless tobacco: Never Used  Substance and Sexual Activity  . Alcohol use: Never    Frequency: Never  . Drug use: Never  . Sexual activity: Not on file  Lifestyle  . Physical activity:    Days per week: Not on file    Minutes per session: Not on file  . Stress: Not on file  Relationships  . Social connections:    Talks on phone: Not on file    Gets together: Not on file   Attends religious service: Not on file    Active member of club or organization: Not on file    Attends meetings of clubs or organizations: Not on file    Relationship status: Not on file  . Intimate partner violence:    Fear of current or ex partner: Not on file    Emotionally abused: Not on file    Physically abused: Not on file    Forced sexual activity: Not on file  Other Topics Concern  . Not on file  Social History Narrative  . Not on file   No family history on file.  OBJECTIVE:  Vitals:   01/25/19 1504  BP: 133/74  Pulse: 92  Resp: 16  Temp: (!) 100.7 F (38.2 C)  TempSrc: Oral  SpO2: 99%     General appearance: alert; appears fatigued, but nontoxic; speaking in full sentences and tolerating own secretions HEENT: NCAT; Ears: EACs clear, TMs pearly gray; Eyes: PERRL.  EOM grossly intact. Nose: nares patent with clear rhinorrhea, turbinates swollen and erythematous, Throat: oropharynx clear, tonsils non erythematous or enlarged, uvula midline  Neck: supple without LAD Lungs: unlabored respirations, symmetrical air entry; cough: mild; no respiratory distress; CTAB Heart: regular rate and rhythm.  Radial pulses 2+ symmetrical bilaterally Skin: warm and dry Psychological: alert and cooperative; normal mood  and affect  ASSESSMENT & PLAN:  1. Influenza A     Meds ordered this encounter  Medications  . oseltamivir (TAMIFLU) 75 MG capsule    Sig: Take 1 capsule (75 mg total) by mouth every 12 (twelve) hours.    Dispense:  10 capsule    Refill:  0    Order Specific Question:   Supervising Provider    Answer:   Eustace Moore [[id number]]   Influenza A positive Get plenty of rest and push fluids Tamiflu prescribed.  Take as directed and to completion Use OTC medications like ibuprofen or tylenol as needed fever or pain Follow up with PCP if symptoms persist Return or go to ER if you have any new or worsening symptoms fever, chills, nausea, vomiting, chest pain,  cough, shortness of breath, wheezing, abdominal pain, changes in bowel or bladder habits, etc...  Reviewed expectations re: course of current medical issues. Questions answered. Outlined signs and symptoms indicating need for more acute intervention. Patient verbalized understanding. After Visit Summary given.         Rennis Harding, PA-C 01/25/19 1549    Alvino Chapel [place], PA-C 01/25/19 1549

## 2019-01-25 NOTE — Discharge Instructions (Signed)
Influenza A positive Get plenty of rest and push fluids Tamiflu prescribed.  Take as directed and to completion Use OTC medications like ibuprofen or tylenol as needed fever or pain Follow up with PCP if symptoms persist Return or go to ER if you have any new or worsening symptoms fever, chills, nausea, vomiting, chest pain, cough, shortness of breath, wheezing, abdominal pain, changes in bowel or bladder habits, etc...Marland Kitchen

## 2019-10-23 ENCOUNTER — Other Ambulatory Visit: Payer: Self-pay

## 2019-10-23 DIAGNOSIS — Z20822 Contact with and (suspected) exposure to covid-19: Secondary | ICD-10-CM

## 2019-10-25 LAB — NOVEL CORONAVIRUS, NAA: SARS-CoV-2, NAA: NOT DETECTED

## 2019-11-29 DIAGNOSIS — Z6827 Body mass index (BMI) 27.0-27.9, adult: Secondary | ICD-10-CM | POA: Diagnosis not present

## 2019-11-29 DIAGNOSIS — L723 Sebaceous cyst: Secondary | ICD-10-CM | POA: Diagnosis not present

## 2019-12-02 DIAGNOSIS — L0291 Cutaneous abscess, unspecified: Secondary | ICD-10-CM | POA: Diagnosis not present

## 2019-12-03 DIAGNOSIS — L0291 Cutaneous abscess, unspecified: Secondary | ICD-10-CM | POA: Diagnosis not present

## 2020-02-17 DIAGNOSIS — J3489 Other specified disorders of nose and nasal sinuses: Secondary | ICD-10-CM | POA: Diagnosis not present

## 2020-02-17 DIAGNOSIS — Z20828 Contact with and (suspected) exposure to other viral communicable diseases: Secondary | ICD-10-CM | POA: Diagnosis not present

## 2020-02-22 DIAGNOSIS — R509 Fever, unspecified: Secondary | ICD-10-CM | POA: Diagnosis not present

## 2020-02-22 DIAGNOSIS — Z20828 Contact with and (suspected) exposure to other viral communicable diseases: Secondary | ICD-10-CM | POA: Diagnosis not present

## 2020-02-22 DIAGNOSIS — B349 Viral infection, unspecified: Secondary | ICD-10-CM | POA: Diagnosis not present

## 2020-02-22 DIAGNOSIS — Z20822 Contact with and (suspected) exposure to covid-19: Secondary | ICD-10-CM | POA: Diagnosis not present

## 2020-02-24 DIAGNOSIS — Z20822 Contact with and (suspected) exposure to covid-19: Secondary | ICD-10-CM | POA: Diagnosis not present

## 2020-02-26 ENCOUNTER — Telehealth: Payer: Self-pay | Admitting: Adult Health

## 2020-02-26 ENCOUNTER — Ambulatory Visit
Admission: EM | Admit: 2020-02-26 | Discharge: 2020-02-26 | Disposition: A | Discharge status: Home / Self Care | Payer: 59 | Attending: Emergency Medicine | Admitting: Emergency Medicine

## 2020-02-26 DIAGNOSIS — B349 Viral infection, unspecified: Secondary | ICD-10-CM | POA: Diagnosis not present

## 2020-02-26 MED ORDER — BENZONATATE 100 MG PO CAPS: 100.0000 mg | ORAL_CAPSULE | Freq: Three times a day (TID) | ORAL | 0 refills | Status: DC

## 2020-02-26 MED ORDER — LORATADINE 10 MG PO TABS: 10.0000 mg | ORAL_TABLET | Freq: Every day | ORAL | 0 refills | Status: DC

## 2020-02-26 MED ORDER — FLUTICASONE PROPIONATE 50 MCG/ACT NA SUSP: 1.0000 | Freq: Every day | NASAL | 0 refills | Status: DC

## 2020-02-26 NOTE — Telephone Encounter (Signed)
Patient states that he has just been seen at [facility] on [address]. Per patient he was told it is viral-given a steroid and nasal spray and cough medication. Patient was advised if still having having fever in 5 days to be seen again.   I advised patient the only advise I had to add was to push fluids and get plenty of rest and that he could alternate Tyelnol and Ibuprofen every 6-8 hrs PRN fever. Patient verbalized understanding and said he would call back if symptoms failed to improve or worsen. AS, CMA

## 2020-02-26 NOTE — Telephone Encounter (Signed)
Pt's wife called states he has had a Fever for 5 dys has been to [facility] for treatment & tested for COVID twice (both results Neg)  --Per wife she has been isolating due to pregnancy & a toddler @ home but is very concern that he is being told its just a virus & will need to run its course (Pt wasn't gvn antibiotic but only flonase frm sinus issues)   Pt would like call back w/ advice & to talk w/ med asst/ Nurse for instructions on what else they can do @ [phone] (wife's dell).  --glh

## 2020-02-26 NOTE — ED Provider Notes (Signed)
[facility]    CSN: [id number] Arrival date & time: 02/26/20  0844      History   Chief Complaint Chief Complaint  Patient presents with  . Fever    HPI Phillip Sheppard is a 34 y.o. male presenting for evaluation of fever, frontal headache, myalgias, chills since Friday.  Patient also noting cough since yesterday.  T-max 103F yesterday.  States he had a temperature of 101F this morning: Has not taken anything for fever today.  States it has been controlled with Tylenol, ibuprofen.  Patient denies visual or auditory changes, numbness or tingling, difficulty speaking with headache.  Denies worst headache of life, thunderclap headache.  States cough is mildly productive with yellow/green sputum: No blood shortness of breath.  Patient does have chest pain that is worse with deep full breath.  No history of DVT/PE.  Since symptom onset patient has undergone multiple Covid test, both rapid and PCR, which were negative, negative influenza testing as well.  Denies active chest pain, palpitations, lightheadedness, difficulty breathing, vomiting, diarrhea or abdominal pain.   History reviewed. No pertinent past medical history.  Patient Active Problem List   Diagnosis Date Noted  . Pharyngitis 11/28/2018  . Healthcare maintenance 06/11/2018  . 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 No pertinent surgical history.     Home Medications    Prior to Admission medications   Medication Sig Start Date End Date Taking? Authorizing Provider  benzonatate (TESSALON) 100 MG capsule Take 1 capsule (100 mg total) by mouth every 8 (eight) hours. 02/26/20   Hall-Potvin, [place], PA-C  fluticasone (FLONASE) 50 MCG/ACT nasal spray Place 1 spray into both nostrils daily. 02/26/20   Hall-Potvin, [place], PA-C  loratadine (CLARITIN) 10 MG tablet Take 1 tablet (10 mg total) by mouth daily. 02/26/20   Hall-Potvin, [place], PA-C    Family History History reviewed. No pertinent family  history.  Social History Social History   Tobacco Use  . Smoking status: Never Smoker  . Smokeless tobacco: Never Used  Substance Use Topics  . Alcohol use: Never  . Drug use: Never     Allergies   Patient has no known allergies.   Review of Systems As per HPI   Physical Exam Triage Vital Signs ED Triage Vitals [02/26/20 0901]  Enc Vitals Group     BP 135/85     Pulse Rate 79     Resp 18     Temp 98.8 F (37.1 C)     Temp Source Oral     SpO2 96 %     Weight      Height      Head Circumference      Peak Flow      Pain Score 4     Pain Loc      Pain Edu?      Excl. in GC?    No data found.  Updated Vital Signs BP 135/85 (BP Location: Left Arm)   Pulse 79   Temp 98.8 F (37.1 C) (Oral)   Resp 18   SpO2 96%   Visual Acuity Right Eye Distance:   Left Eye Distance:   Bilateral Distance:    Right Eye Near:   Left Eye Near:    Bilateral Near:     Physical Exam Constitutional:      General: He is not in acute distress.    Appearance: He is obese. He is not toxic-appearing or diaphoretic.  HENT:  Head: Normocephalic and atraumatic.     Right Ear: Tympanic membrane, ear canal and external ear normal.     Left Ear: Tympanic membrane, ear canal and external ear normal.     Nose: Nose normal.     Comments: Negative sinus tenderness bilaterally    Mouth/Throat:     Mouth: Mucous membranes are moist.     Pharynx: Oropharynx is clear.  Eyes:     General: No scleral icterus.       Right eye: No discharge.        Left eye: No discharge.     Conjunctiva/sclera: Conjunctivae normal.     Pupils: Pupils are equal, round, and reactive to light.  Neck:     Comments: Trachea midline, negative JVD Cardiovascular:     Rate and Rhythm: Normal rate and regular rhythm.  Pulmonary:     Effort: Pulmonary effort is normal. No respiratory distress.     Breath sounds: No wheezing or rales.  Musculoskeletal:     Cervical back: Neck supple. No tenderness.      Right lower leg: No edema.     Left lower leg: No edema.  Lymphadenopathy:     Cervical: No cervical adenopathy.  Skin:    General: Skin is warm.     Capillary Refill: Capillary refill takes less than 2 seconds.     Coloration: Skin is not jaundiced or pale.     Findings: No rash.  Neurological:     General: No focal deficit present.     Mental Status: He is alert and oriented to person, place, and time.      UC Treatments / Results  Labs (all labs ordered are listed, but only abnormal results are displayed) Labs Reviewed - No data to display  EKG   Radiology No results found.  Procedures Procedures (including critical care time)  Medications Ordered in UC Medications - No data to display  Initial Impression / Assessment and Plan / UC Course  I have reviewed the triage vital signs and the nursing notes.  Pertinent labs & imaging results that were available during my care of the patient were reviewed by me and considered in my medical decision making (see chart for details).     Patient afebrile, nontoxic and without signs of respiratory distress in office today.  Patient has already undergone multiple negative Covid testing, negative influenza, and is now outside of treatment window for Tamiflu: Repeat testing deferred.  Appears to be viral illness with cough starting yesterday and reassuring cardiopulmonary exam today in office: CXR deferred at this time.  We will treat supportively as outlined below.  Return precautions discussed, patient verbalized understanding and is agreeable to plan. Final Clinical Impressions(s) / UC Diagnoses   Final diagnoses:  Viral illness     Discharge Instructions     Tessalon for cough. Start flonase, atrovent nasal spray for nasal congestion/drainage. You can use over the counter nasal saline rinse such as neti pot for nasal congestion. Keep hydrated, your urine should be clear to pale yellow in color. Tylenol/motrin for fever and  pain. Monitor for any worsening of symptoms, chest pain, shortness of breath, wheezing, swelling of the throat, go to the emergency department for further evaluation needed.     ED Prescriptions    Medication Sig Dispense Auth. Provider   benzonatate (TESSALON) 100 MG capsule Take 1 capsule (100 mg total) by mouth every 8 (eight) hours. 21 capsule Hall-Potvin, [place], PA-C   fluticasone (FLONASE) 50  MCG/ACT nasal spray Place 1 spray into both nostrils daily. 16 g Hall-Potvin, [place], PA-C   loratadine (CLARITIN) 10 MG tablet Take 1 tablet (10 mg total) by mouth daily. 30 tablet Hall-Potvin, [place], PA-C     PDMP not reviewed this encounter.   Hall-Potvin, [place], [place] 02/26/20 1450

## 2020-02-26 NOTE — Discharge Instructions (Signed)

## 2020-02-26 NOTE — ED Triage Notes (Signed)
Pt c/o fever, headache, body aches, chills, and cough since last Friday, sore throat and chest pain started yesterday. States has had 4 neg covid and neg flu test since. States fever 103 yesterday. Last tylenol 6pm yesterday.

## 2020-02-26 NOTE — Telephone Encounter (Signed)
Noted thank you

## 2020-02-28 ENCOUNTER — Ambulatory Visit (INDEPENDENT_AMBULATORY_CARE_PROVIDER_SITE_OTHER): Payer: 59 | Admitting: Infectious Diseases

## 2020-02-28 ENCOUNTER — Telehealth: Payer: Self-pay | Admitting: Adult Health

## 2020-02-28 VITALS — BP 132/76 | HR 74 | Temp 98.9°F | Ht 75.0 in | Wt 296.0 lb

## 2020-02-28 DIAGNOSIS — R509 Fever, unspecified: Secondary | ICD-10-CM | POA: Diagnosis not present

## 2020-02-28 DIAGNOSIS — R05 Cough: Secondary | ICD-10-CM | POA: Diagnosis not present

## 2020-02-28 DIAGNOSIS — R079 Chest pain, unspecified: Secondary | ICD-10-CM | POA: Diagnosis not present

## 2020-02-28 DIAGNOSIS — R059 Cough, unspecified: Secondary | ICD-10-CM

## 2020-02-28 MED ORDER — DOXYCYCLINE HYCLATE 100 MG PO TABS: 100.0000 mg | ORAL_TABLET | Freq: Two times a day (BID) | ORAL | 0 refills | Status: DC

## 2020-02-28 NOTE — Telephone Encounter (Signed)
Spoke with patients wife who states that patient had been exposed to COVID a few weeks ago. Had rapid covid test preformed on 02/17/20 at [facility] urgent care -negative.  02/21/20-patient became symptomatic. Chills and fever 100.9. 02/22/20- Body aches, chills, fever. Patient seen at [facility] in [place] had PCR and Rapid COVID - negative. 02/26/20 Seen at [facility] on [address]. Covid rapid-negative, Flu-negative. Patient given steroids, flonase and cough meds. Today-patient has fever of 101 with body aches, sore throat, headache and cough.   Patient wife calling stating she is very concerned because of husbands temp and symptoms that have been going on for 7 days. She is requesting advise.  AS, CMA

## 2020-02-28 NOTE — Patient Instructions (Addendum)
Nice to see you today.  I worry with your chest pain and ongoing fevers/cough you may have pneumonia.   I would like to start you on doxycycline one capsule twice a day to treat this. If you continue to have fevers please see your PCP or you are welcome to come back here.   Rest, fluids, and continue fever reducers as you are.

## 2020-02-28 NOTE — Telephone Encounter (Signed)
Pt's wife called states she is concerned he still has a fever & was told if fever still present 5 dys to contact proivider, so it's been 5 dys & she is request Antibiotics (advised her they will need to get that from the [facility] provider who most recently treated patient--- Transferred call to med asst to advise if pt needs to go to Emergency Dept.  --glh ,

## 2020-02-28 NOTE — Telephone Encounter (Signed)
Per Dr. Sharee Holster she thinks it would be best for patient to be seen at [facility] respiratory clinic due to ongoing symptoms. Spoke with Michelle-patient wife aware of the above and verbalized understanding and request referral to [facility] respiratory clinic. AS, CMA

## 2020-02-28 NOTE — Progress Notes (Signed)
[facility] RESPIRATORY CLINIC                                       Acute visit note   Date:  02/28/2020   ID:  Zerrick Hanssen, DOB 23-Aug-1986, MRN [id number]  PCP:  Julaine Fusi, NP   CC:  Chief Complaint  Patient presents with  . Cough    History of Present Illness:  Linell Shawn is a 34 y.o. male who presents to [facility] Respiratory clinic for evaluation of cough and fever x 7 days.   Has reports fevers (measured to 103 F), chills headache, body aches, chest pain now for 1 week. Associated decreased appetite. He initially presented with nasal congestion/runny nose but these symptoms have resolved. No sore throat, ear pain, runny nose, watery eyes, diarrhea. He has been tested several times for COVID both with rapid Ag and PCR testing - both negative with most recent test yesterday.  Has been using tylenol and ibuprofen. Went to the ER a few days ago and given Flonase, steroids and tesslon pearls.   Non smoker. Not taking any medications for chronic conditions regularly. He works full time Public house manager.  His wife is currently pregnant and sleeping in another room of the house. She is not ill.    Current Medications: Outpatient Medications Prior to Visit  Medication Sig Dispense Refill  . benzonatate (TESSALON) 100 MG capsule Take 1 capsule (100 mg total) by mouth every 8 (eight) hours. 21 capsule 0  . fluticasone (FLONASE) 50 MCG/ACT nasal spray Place 1 spray into both nostrils daily. 16 g 0  . loratadine (CLARITIN) 10 MG tablet Take 1 tablet (10 mg total) by mouth daily. 30 tablet 0   No facility-administered medications prior to visit.     Allergies:   Bee venom   Family History:  The patient's family history is not on file.     ROS:   Please see the history of present illness.    ROS All other systems reviewed and are negative.   PHYSICAL EXAM:   VS:  BP 132/76   Pulse 74   Temp 98.9 F (37.2 C) (Oral)   Ht 6\' 3"  (1.905 m)   Wt 296 lb (134.3 kg)    SpO2 93%   BMI 37.00 kg/m    GEN: Well nourished, well developed, in no acute distress. Appears mildly ill.  HEENT: conjunctiva clear, R tympanic membrane appears mildly erythematous. L ear normal. Throat appears moist and clear without erythema or exudate.  Neck: no JVD or masses. No adenopathy of the head or neck.  Cardiac: RRR; no murmurs, rubs, or gallops,no edema  Respiratory: clear to auscultation anteriorly, diminished breath sounds L base posteriorly. No rhonchi or rales. Slight increased work of breathing without accessory use GI: soft, nontender, nondistended, + BS MS: no deformity or atrophy Skin: warm and dry, no rash Neuro:  Alert and Oriented x 3, Strength and sensation are intact Psych: euthymic mood, full affect   Wt Readings from Last 3 Encounters:  02/28/20 296 lb (134.3 kg)  11/28/18 271 lb 6.4 oz (123.1 kg)  06/11/18 273 lb (123.8 kg)      Studies/Labs Reviewed:    Recent Labs: No results found for requested labs within last 8760 hours.     ASSESSMENT & PLAN:   Fever, Cough, Chest Pain x 1 week that is worsening with conservative measures  and supportive care. Several negative COVID 19 tests - encouraged to defer further testing. Flu negative recently.  Sounds like he had initially presented with viral URI now concerning for possible secondary bacterial pneumonia. R ear erythematous but non painful. Still having daily fevers to > 101 with last measured fever this afternoon. - Will prescribe doxycycline x 5 days.  - Encouraged to continue supportive care for symptom management, rest, fluids.  - Defer xray today as he has no concern for complicated PNA picture.  - Fever reducers as he has been doing.  - Follow up with PCP next week if no improvement.    -Symptoms tier reviewed as well as criteria for ending isolation. Preventative practices reviewed. The patient was given clear instructions to go to ER or return to medical center if symptoms do not improve,  worsen or new problems develop. The patient verbalized understanding.   Medication Adjustments/Labs and Tests Ordered: Current medicines are reviewed at length with the patient today.  Concerns regarding medicines are outlined above.  Medication changes, Labs and Tests ordered today are listed in the Patient Instructions below. There are no Patient Instructions on file for this visit.   Signed, Janene Madeira, NP  02/28/2020 7:00 PM       [facility], PRN Provider  [facility]. [place], [place] Clinic Phone: [phone] Clinic Fax: [phone] Clinic Hours: 5:30 pm -7:30 pm (Monday-Friday)

## 2020-03-02 ENCOUNTER — Ambulatory Visit (INDEPENDENT_AMBULATORY_CARE_PROVIDER_SITE_OTHER): Payer: 59

## 2020-03-02 ENCOUNTER — Telehealth: Payer: Self-pay | Admitting: Adult Health

## 2020-03-02 ENCOUNTER — Ambulatory Visit (INDEPENDENT_AMBULATORY_CARE_PROVIDER_SITE_OTHER): Payer: 59 | Admitting: Family Medicine

## 2020-03-02 VITALS — BP 104/62 | HR 80 | Temp 98.3°F

## 2020-03-02 DIAGNOSIS — Z20822 Contact with and (suspected) exposure to covid-19: Secondary | ICD-10-CM

## 2020-03-02 DIAGNOSIS — Z20828 Contact with and (suspected) exposure to other viral communicable diseases: Secondary | ICD-10-CM | POA: Diagnosis not present

## 2020-03-02 DIAGNOSIS — R05 Cough: Secondary | ICD-10-CM

## 2020-03-02 DIAGNOSIS — J189 Pneumonia, unspecified organism: Secondary | ICD-10-CM | POA: Diagnosis not present

## 2020-03-02 DIAGNOSIS — R918 Other nonspecific abnormal finding of lung field: Secondary | ICD-10-CM | POA: Diagnosis not present

## 2020-03-02 DIAGNOSIS — R509 Fever, unspecified: Secondary | ICD-10-CM

## 2020-03-02 DIAGNOSIS — R059 Cough, unspecified: Secondary | ICD-10-CM

## 2020-03-02 MED ORDER — AZITHROMYCIN 250 MG PO TABS: ORAL_TABLET | ORAL | 0 refills | Status: DC

## 2020-03-02 MED ORDER — METHYLPREDNISOLONE SODIUM SUCC 125 MG IJ SOLR
125.0000 mg | Freq: Once | INTRAMUSCULAR | Status: AC
  Administered 2020-03-02: 125 mg via INTRAMUSCULAR

## 2020-03-02 MED ORDER — DOXYCYCLINE HYCLATE 100 MG PO CAPS: 100.0000 mg | ORAL_CAPSULE | Freq: Two times a day (BID) | ORAL | 0 refills | Status: AC

## 2020-03-02 MED ORDER — ALBUTEROL SULFATE HFA 108 (90 BASE) MCG/ACT IN AERS: 2.0000 | INHALATION_SPRAY | RESPIRATORY_TRACT | Status: AC | PRN

## 2020-03-02 MED ORDER — ONDANSETRON 4 MG PO TBDP: 4.0000 mg | ORAL_TABLET | Freq: Three times a day (TID) | ORAL | 0 refills | Status: DC | PRN

## 2020-03-02 MED ORDER — PREDNISONE 20 MG PO TABS: 40.0000 mg | ORAL_TABLET | Freq: Every day | ORAL | 0 refills | Status: AC

## 2020-03-02 NOTE — Telephone Encounter (Signed)
Pt's wife called states he still isn't feeling any better & that they went to [facility] Respiratory clinic on Friday was Dx w/ Pneumonia & gvn Rx but its been several days & he still isn't any better.  --Forwarding message to med asst & provider that they wish to be called back.  --glh

## 2020-03-02 NOTE — Progress Notes (Signed)
Patient ID: Phillip Sheppard, male    DOB: 1986-05-15, 34 y.o.   MRN: [id number]  PCP: Esaw Grandchild, NP  No chief complaint on file.   Subjective:  HPI  Phillip Sheppard is a 34 y.o. male presents to [facility] Respiratory clinic for follow-up of pneumonia. Seen previously at the respiratory clinic on 02/28/20 for evaluation of symptoms and treatment for pneumonia initiated with Doxycyline. Patient exposured at work to COVID-19, has tested negative with a combination of rapids and send out twice a total of 4 times. Most recent negative test less than 5 days ago. He had not achieved improvement of SOB, cough, fever, fatigue, or nausea. His wife became concern as he is now wheezing and has no prior asthma history.  He does not have inhaler and has not been treated with any steroid oral treatments.  He has been taking Mucinex and was prescribed benzonatate at an urgent care visit otherwise no treatment with antibiotics and or inhaled albuterol.  Overall he feels his symptoms are worsening. Symptom had been present for 11 days.  He is high risk for complications related to  COVID-19 infection due to obesity.   Review of Systems Pertinent negatives listed in HPI  Patient Active Problem List   Diagnosis Date Noted  . Pharyngitis 11/28/2018  . Healthcare maintenance 06/11/2018  . 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 Authorizing Provider  benzonatate (TESSALON) 100 MG capsule Take 1 capsule (100 mg total) by mouth every 8 (eight) hours. 02/26/20  Yes Hall-Potvin, [place], PA-C  doxycycline (VIBRA-TABS) 100 MG tablet Take 1 tablet (100 mg total) by mouth 2 (two) times daily. 02/28/20  Yes Inverness Callas, NP  fluticasone (FLONASE) 50 MCG/ACT nasal spray Place 1 spray into both nostrils daily. 02/26/20  Yes Hall-Potvin, [place], PA-C  loratadine (CLARITIN) 10 MG tablet Take 1 tablet (10 mg total) by mouth daily. 02/26/20  Yes  Hall-Potvin, [place], PA-C    Past Medical, Surgical Family and Social History reviewed and updated.    Objective:   Today's Vitals   03/02/20 1811  BP: 104/62  Pulse: 80  Temp: 98.3 F (36.8 C)  TempSrc: Oral  SpO2: 92%    Wt Readings from Last 3 Encounters:  02/28/20 296 lb (134.3 kg)  11/28/18 271 lb 6.4 oz (123.1 kg)  06/11/18 273 lb (123.8 kg)   Physical Exam Constitutional:      Appearance: He is obese. He is ill-appearing. He is not toxic-appearing or diaphoretic.  HENT:     Head: Normocephalic.  Cardiovascular:     Rate and Rhythm: Normal rate and regular rhythm.  Pulmonary:     Breath sounds: Wheezing and rales present.     Comments: Increase work of breathing Abdominal:     General: There is distension.  Musculoskeletal:        General: Normal range of motion.     Cervical back: Normal range of motion and neck supple.  Skin:    General: Skin is warm.  Neurological:     General: No focal deficit present.  Psychiatric:        Mood and Affect: Mood normal.     Assessment & Plan:  1. Pneumonia, left perihilar -Extending Doxycyline an additional 5 days for total of 100 mg BID x 10 days. -Adding Azithromycin  (ZPAK) -Prednisone 40 mg x 5 days -Albuterol inhaler every 4-6 hours as needed for cough. - methylPREDNISolone  sodium succinate (SOLU-MEDROL) 125 mg/2 mL injection 125 mg, ordered and administered in clinic.  DG Chest Portable 2 Views Result Date: 03/02/2020 CLINICAL DATA:  Cough and COVID exposure EXAM: CHEST  2 VIEW PORTABLE COMPARISON:  None. FINDINGS: Left vague perihilar airspace opacity. No pleural effusion. Normal heart size. No pneumothorax. IMPRESSION: Vague left perihilar airspace opacity consistent with pneumonia, potentially atypical or viral pneumonia. Electronically Signed   By: Jasmine Pang M.D.   On: 03/02/2020 18:45    3. Fever, unspecified fever cause -Pneumonia confirmed by chest x-ray and suspect active COVID-19  infection. -Continue antipyretic for fever management -Encouraged hydrating well with water. - CBC with Differential/Platelet, pending  - Temperature monitoring; Future  4. Suspected COVID-19 virus infection, deferred retest given a total of 4 negative test and recent close exposure to co-worker that has subsequently tested positive for COVID-19.  -Treated as COVID-19 patient given the onset of symptoms, recent exposure and chest x-ray today confirms pneumonia. Discussed at length red flags that warrant immediate follow-up at the ER.  Checking CBC to rule elevated WBC, evaluate platelet count and hemoglobin Checking CMP to rule out AKI , check electrolytes, and evaluate liver function. - methylPREDNISolone sodium succinate (SOLU-MEDROL) 125 mg/2 mL injection 125 mg - albuterol (VENTOLIN HFA) 108 (90 Base) MCG/ACT inhaler 2 puff - Temperature monitoring; Future   -The patient was given clear instructions to go to ER or return to medical center if symptoms do not improve, worsen or new problems develop. The patient verbalized understanding.     Joaquin Courts, FNP-C [facility], PRN Provider  [facility]. [place], [place]  Clinic Phone: [phone] Clinic Fax: [phone] Clinic Hours: 5:30 pm -7:30 pm (Monday-Friday)

## 2020-03-02 NOTE — Telephone Encounter (Signed)
Note   Per Dr. Sharee Holster she thinks it would be best for patient to be seen at [facility] respiratory clinic due to ongoing symptoms. Spoke with Michelle-patient wife aware of the above and verbalized understanding and request referral to [facility] respiratory clinic. AS, CMA        [facility]   02/28/20 11:14 AM Genella Rife L routed this conversation to [facility]  Carl Best   [facility]   02/28/20 11:14 AM Note   Pt's wife called states she is concerned he still has a fever & was told if fever still present 5 dys to contact proivider, so it's been 5 dys & she is request Antibiotics (advised her they will need to get that from the [facility] provider who most recently treated patient--- Transferred call to med asst to advise if pt needs to go to Emergency Dept.   --glh ,          02/28/20 11:07 AM You routed this conversation to Thomasene Lot, DO   Me      02/28/20 11:07 AM Note   Spoke with patients wife who states that patient had been exposed to COVID a few weeks ago. Had rapid covid test preformed on 02/17/20 at [facility] urgent care -negative.  02/21/20-patient became symptomatic. Chills and fever 100.9. 02/22/20- Body aches, chills, fever. Patient seen at [facility] in [place] had PCR and Rapid COVID - negative. 02/26/20 Seen at UC on [address]. Covid rapid-negative, Flu-negative. Patient given steroids, flonase and cough meds. Today-patient has fever of 101 with body aches, sore throat, headache and cough.   Patient wife calling stating she is very concerned because of husbands temp and symptoms that have been going on for 7 days. She is requesting advise.  AS, CMA        Patients wife calling office today stating that patient is not any better and would like advise. Please advise. AS, CMA

## 2020-03-02 NOTE — Progress Notes (Signed)
Patient has taken tylenol and ibuprofen today when fever spiked to 101.6

## 2020-03-02 NOTE — Telephone Encounter (Signed)
Per Dr. Sharee Holster patient should keep apt today at respiratory clinic at 615pm. Patient is aware of this and verbalized understanding. AS< CMA

## 2020-03-04 ENCOUNTER — Other Ambulatory Visit: Payer: Self-pay

## 2020-03-04 ENCOUNTER — Telehealth: Payer: Self-pay

## 2020-03-04 ENCOUNTER — Other Ambulatory Visit (INDEPENDENT_AMBULATORY_CARE_PROVIDER_SITE_OTHER): Payer: 59

## 2020-03-04 DIAGNOSIS — R748 Abnormal levels of other serum enzymes: Secondary | ICD-10-CM

## 2020-03-04 LAB — COMPREHENSIVE METABOLIC PANEL
ALT: 504 IU/L (ref 0–44)
AST: 499 IU/L — ABNORMAL HIGH (ref 0–40)
Albumin/Globulin Ratio: 1.4 (ref 1.2–2.2)
Albumin: 3.7 g/dL — ABNORMAL LOW (ref 4.0–5.0)
Alkaline Phosphatase: 86 IU/L (ref 39–117)
BUN/Creatinine Ratio: 8 — ABNORMAL LOW (ref 9–20)
BUN: 8 mg/dL (ref 6–20)
Bilirubin Total: 0.4 mg/dL (ref 0.0–1.2)
CO2: 25 mmol/L (ref 20–29)
Calcium: 8.4 mg/dL — ABNORMAL LOW (ref 8.7–10.2)
Chloride: 102 mmol/L (ref 96–106)
Creatinine, Ser: 1.01 mg/dL (ref 0.76–1.27)
GFR calc Af Amer: 113 mL/min/{1.73_m2} (ref >59)
GFR calc non Af Amer: 98 mL/min/{1.73_m2} (ref >59)
Globulin, Total: 2.7 g/dL (ref 1.5–4.5)
Glucose: 89 mg/dL (ref 65–99)
Potassium: 5 mmol/L (ref 3.5–5.2)
Sodium: 141 mmol/L (ref 134–144)
Total Protein: 6.4 g/dL (ref 6.0–8.5)

## 2020-03-04 LAB — CBC WITH DIFFERENTIAL/PLATELET
Basophils Absolute: 0 10*3/uL (ref 0.0–0.2)
Basos: 0 %
EOS (ABSOLUTE): 0 10*3/uL (ref 0.0–0.4)
Eos: 0 %
Hematocrit: 50.9 % (ref 37.5–51.0)
Hemoglobin: 16.9 g/dL (ref 13.0–17.7)
Immature Grans (Abs): 0 10*3/uL (ref 0.0–0.1)
Immature Granulocytes: 1 %
Lymphocytes Absolute: 1.2 10*3/uL (ref 0.7–3.1)
Lymphs: 33 %
MCH: 28.5 pg (ref 26.6–33.0)
MCHC: 33.2 g/dL (ref 31.5–35.7)
MCV: 86 fL (ref 79–97)
Monocytes Absolute: 0.3 10*3/uL (ref 0.1–0.9)
Monocytes: 8 %
Neutrophils Absolute: 2.1 10*3/uL (ref 1.4–7.0)
Neutrophils: 58 %
Platelets: 155 10*3/uL (ref 150–450)
RBC: 5.92 x10E6/uL — ABNORMAL HIGH (ref 4.14–5.80)
RDW: 13.7 % (ref 11.6–15.4)
WBC: 3.6 10*3/uL (ref 3.4–10.8)

## 2020-03-04 NOTE — Addendum Note (Signed)
Addended by: Stan Head on: 03/04/2020 01:18 PM   Modules accepted: Orders

## 2020-03-04 NOTE — Telephone Encounter (Signed)
----- Message from Mickel Crow, Parchment sent at 03/04/2020  1:05 PM EST ----- Regarding: FW: Eleavted LFTs  ----- Message ----- From: Mellody Dance, DO Sent: 03/04/2020  12:31 PM EST To: [facility] Subject: FW: Eleavted LFTs                              Phillip Sheppard sent me a message which I just read moments ago notifying me of information below that was sent to the patient from the nurse practitioner who took care of him in the [facility] respiratory clinic.     It appears patient was sent this message below at 9:30 AM today by the NP who took care of him recently on 03/02/2020.    Can you please call patient to see if he intends to follow-up in the respiratory clinic for repeat blood work as instructed below?   If he has not done this yet, and if he prefers, he can always come in here for repeat ALT and AST today or tomorrow.  With diagnosis of elevated liver enzymes and dx of Covid infection.   Also please add an acute hepatitis panel to rule out hepatitis A, B or C infection. -     If these repeat liver enzymes are still elevated, then a right upper quadrant ultrasound would be warranted plus possible GI referral.    Please feel free to discuss this possible treatment plan with patient.  -Furthermore, please tell patient to stop alcohol, and any other hepatotoxic substances that he may be taking including any products containing acetaminophen.   I am glad they informed him that they feel he has Covid infection and had put him on Covid isolation precautions etc.  Despite negative testing, he clinically has Covid infection- which is what we thought.  Please make sure patient understands this and is taking all proper precautions and keeping himself isolated and away from others for at least 10 days since symptom onset and, he must be fever free with improvement of symptoms in order to come out of isolation.    "Phillip Sheppard,  Your liver enzymes were very elevated from the blood work  collected in the respiratory clinic on Monday night. You will need to have this blood work repeated today. I would like for you to follow-up in the respiratory clinic tonight or Friday for repeat blood work? Also, I have messaged your primary care provider as I think it is a good idea to schedule you for a liver ultrasound given the elevation in these numbers. I also attribute this elevation in your liver enzymes to COVID in spite of your recent negative test, given your close exposure, pneumonia and other symptoms, findings are consistent with COVID-19 infection. Please let me know if you are able to return to the clinic tonight for blood work in order for me to have placed on the schedule. Written by Phillip Jun, FNP on 03/04/2020 9:36 AM EST "    ----- Message ----- From: Esaw Grandchild, NP Sent: 03/04/2020  10:24 AM EST To: Mellody Dance, DO Subject: Eleavted LFTs                                  Good Morning Dr. Raliegh Sheppard, Phillip Sheppard was recently seen at [facility] and treated for Pneumonia. His CMP demonstrated elevated LFTs and the  the ordering provider reached out to me, since I am still listed as PCP.  She is recommending Abd [place], that clinic cannot order it and requesting that the PCP place orders. Hope you are well! Orpha Bur

## 2020-03-04 NOTE — Telephone Encounter (Signed)
Pt informed.  Pt expressed understanding and states that he will come for labs this afternoon.  Pt states that it has been 13 days since onset of symptoms, no fever and that symptoms are improving.  Tiajuana Amass, CMA

## 2020-03-05 ENCOUNTER — Other Ambulatory Visit: Payer: Self-pay | Admitting: Family Medicine

## 2020-03-05 DIAGNOSIS — R748 Abnormal levels of other serum enzymes: Secondary | ICD-10-CM

## 2020-03-05 LAB — HEPATITIS PANEL, ACUTE
Hep A IgM: NEGATIVE
Hep B C IgM: NEGATIVE
Hep C Virus Ab: <0.1 s/co ratio (ref 0.0–0.9)
Hepatitis B Surface Ag: NEGATIVE

## 2020-03-05 LAB — AST: AST: 256 IU/L — ABNORMAL HIGH (ref 0–40)

## 2020-03-05 LAB — ALT: ALT: 355 IU/L — ABNORMAL HIGH (ref 0–44)

## 2020-03-05 NOTE — Progress Notes (Signed)
Patient is aware of the results and the message below. I advised patient we need to recheck labs in 2-4 weeks and offered to transfer call to front desk for scheduling and he stated he was unable to schedule apt at this time and would call back. Ordering future labs per Dr. Sharee Holster. AS, CMA   Thomasene Lot, DO  03/05/2020 8:52 AM EST    LFT's are half of what they were just 3 days ago. Hence, there is no need for liver ultrasound at this time, this is likely due to his current infection with Covid. I recommend we recheck this in 2-4 weeks w as he continues to improve with his symptoms. Like always, no alcohol, Tylenol or acetaminophen products etc.   Future labs ordered. AS, CMA

## 2020-08-18 ENCOUNTER — Encounter: Payer: 59 | Admitting: Physician Assistant

## 2020-08-31 ENCOUNTER — Telehealth: Payer: 59 | Admitting: Family

## 2020-08-31 DIAGNOSIS — J029 Acute pharyngitis, unspecified: Secondary | ICD-10-CM

## 2020-08-31 DIAGNOSIS — Z20818 Contact with and (suspected) exposure to other bacterial communicable diseases: Secondary | ICD-10-CM | POA: Diagnosis not present

## 2020-08-31 MED ORDER — AMOXICILLIN 500 MG PO CAPS: 500.0000 mg | ORAL_CAPSULE | Freq: Two times a day (BID) | ORAL | 0 refills | Status: DC

## 2020-08-31 NOTE — Progress Notes (Signed)

## 2020-11-11 ENCOUNTER — Telehealth: Payer: 59 | Admitting: Nurse Practitioner

## 2020-11-11 DIAGNOSIS — R059 Cough, unspecified: Secondary | ICD-10-CM

## 2020-11-11 MED ORDER — FLUTICASONE PROPIONATE 50 MCG/ACT NA SUSP: 2.0000 | Freq: Every day | NASAL | 6 refills | Status: DC

## 2020-11-11 MED ORDER — BENZONATATE 100 MG PO CAPS: 100.0000 mg | ORAL_CAPSULE | Freq: Three times a day (TID) | ORAL | 0 refills | Status: DC | PRN

## 2020-11-11 NOTE — Progress Notes (Signed)
We are sorry you are not feeling well.  Here is how we plan to help!  Based on what you have shared with me, it looks like you may have a viral upper respiratory infection.  Upper respiratory infections are caused by a large number of viruses; however, rhinovirus is the most common cause.   Providers prescribe antibiotics to treat infections caused by bacteria. Antibiotics are very powerful in treating bacterial infections when they are used properly. To maintain their effectiveness, they should be used only when necessary. Overuse of antibiotics has resulted in the development of superbugs that are resistant to treatment!    After careful review of your answers, I would not recommend an antibiotic for your condition.  Antibiotics are not effective against viruses and therefore should not be used to treat them. Common examples of infections caused by viruses include colds and flu   Symptoms vary from person to person, with common symptoms including sore throat, cough, fatigue or lack of energy and feeling of general discomfort.  A low-grade fever of up to 100.4 may present, but is often uncommon.  Symptoms vary however, and are closely related to a person's age or underlying illnesses.  The most common symptoms associated with an upper respiratory infection are nasal discharge or congestion, cough, sneezing, headache and pressure in the ears and face.  These symptoms usually persist for about 3 to 10 days, but can last up to 2 weeks.  It is important to know that upper respiratory infections do not cause serious illness or complications in most cases.    Upper respiratory infections can be transmitted from person to person, with the most common method of transmission being a person's hands.  The virus is able to live on the skin and can infect other persons for up to 2 hours after direct contact.  Also, these can be transmitted when someone coughs or sneezes; thus, it is important to cover the mouth to  reduce this risk.  To keep the spread of the illness at bay, good hand hygiene is very important.  This is an infection that is most likely caused by a virus. There are no specific treatments other than to help you with the symptoms until the infection runs its course.  We are sorry you are not feeling well.  Here is how we plan to help!   For nasal congestion, you may use an oral decongestants such as Mucinex D or if you have glaucoma or high blood pressure use plain Mucinex.  Saline nasal spray or nasal drops can help and can safely be used as often as needed for congestion.  For your congestion, I have prescribed Fluticasone nasal spray one spray in each nostril twice a day  If you do not have a history of heart disease, hypertension, diabetes or thyroid disease, prostate/bladder issues or glaucoma, you may also use Sudafed to treat nasal congestion.  It is highly recommended that you consult with a pharmacist or your primary care physician to ensure this medication is safe for you to take.     If you have a cough, you may use cough suppressants such as Delsym and Robitussin.  If you have glaucoma or high blood pressure, you can also use Coricidin HBP.   For cough I have prescribed for you A prescription cough medication called Tessalon Perles 100 mg. You may take 1-2 capsules every 8 hours as needed for cough  If you have a sore or scratchy throat, use a saltwater  gargle-  to  teaspoon of salt dissolved in a 4-ounce to 8-ounce glass of warm water.  Gargle the solution for approximately 15-30 seconds and then spit.  It is important not to swallow the solution.  You can also use throat lozenges/cough drops and Chloraseptic spray to help with throat pain or discomfort.  Warm or cold liquids can also be helpful in relieving throat pain.  For headache, pain or general discomfort, you can use Ibuprofen or Tylenol as directed.   Some authorities believe that zinc sprays or the use of Echinacea may  shorten the course of your symptoms.   HOME CARE . Only take medications as instructed by your medical team. . Be sure to drink plenty of fluids. Water is fine as well as fruit juices, sodas and electrolyte beverages. You may want to stay away from caffeine or alcohol. If you are nauseated, try taking small sips of liquids. How do you know if you are getting enough fluid? Your urine should be a pale yellow or almost colorless. . Get rest. . Taking a steamy shower or using a humidifier may help nasal congestion and ease sore throat pain. You can place a towel over your head and breathe in the steam from hot water coming from a faucet. . Using a saline nasal spray works much the same way. . Cough drops, hard candies and sore throat lozenges may ease your cough. . Avoid close contacts especially the very young and the elderly . Cover your mouth if you cough or sneeze . Always remember to wash your hands.   GET HELP RIGHT AWAY IF: . You develop worsening fever. . If your symptoms do not improve within 10 days . You develop yellow or green discharge from your nose over 3 days. . You have coughing fits . You develop a severe head ache or visual changes. . You develop shortness of breath, difficulty breathing or start having chest pain . Your symptoms persist after you have completed your treatment plan  MAKE SURE YOU   Understand these instructions.  Will watch your condition.  Will get help right away if you are not doing well or get worse.  Your e-visit answers were reviewed by a board certified advanced clinical practitioner to complete your personal care plan. Depending upon the condition, your plan could have included both over the counter or prescription medications. Please review your pharmacy choice. If there is a problem, you may call our nursing hot line at and have the prescription routed to another pharmacy. Your safety is important to [place]. If you have drug allergies check your  prescription carefully.   You can use MyChart to ask questions about today's visit, request a non-urgent call back, or ask for a work or school excuse for 24 hours related to this e-Visit. If it has been greater than 24 hours you will need to follow up with your provider, or enter a new e-Visit to address those concerns. You will get an e-mail in the next two days asking about your experience.  I hope that your e-visit has been valuable and will speed your recovery. Thank you for using e-visits.     5-10 minutes spent reviewing and documenting in chart.

## 2020-11-12 MED ORDER — AZITHROMYCIN 250 MG PO TABS: ORAL_TABLET | ORAL | 0 refills | Status: DC

## 2020-11-12 NOTE — Progress Notes (Signed)
RX for azithromycin sent per Dr. Hyacinth Meeker.

## 2020-11-12 NOTE — Addendum Note (Signed)
Addended by: Roxy Horseman B on: 11/12/2020 02:29 PM   Modules accepted: Orders

## 2020-12-30 NOTE — Progress Notes (Signed)
Subjective:    Patient ID: Phillip Sheppard, male    DOB: 11/03/1986, 35 y.o.   MRN: [id number]  HPI  He is here to establish with a new pcp.   He is here for a physical exam.     Medications and allergies reviewed with patient and updated if appropriate.  Patient Active Problem List   Diagnosis Date Noted  . Pharyngitis 11/28/2018  . Healthcare maintenance 06/11/2018  . 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 azithromycin (ZITHROMAX) 250 MG tablet Take 2 tablets today, then 1 tablet daily until gone. 6 tablet 0  . benzonatate (TESSALON PERLES) 100 MG capsule Take 1 capsule (100 mg total) by mouth 3 (three) times daily as needed. 20 capsule 0  . fluticasone (FLONASE) 50 MCG/ACT nasal spray Place 2 sprays into both nostrils daily. 16 g 6  . loratadine (CLARITIN) 10 MG tablet Take 1 tablet (10 mg total) by mouth daily. 30 tablet 0  . ondansetron (ZOFRAN ODT) 4 MG disintegrating tablet Take 1 tablet (4 mg total) by mouth every 8 (eight) hours as needed for nausea or vomiting. 20 tablet 0   Current Facility-Administered Medications on File Prior to Visit  Medication Dose Route Frequency Provider Last Rate Last Admin  . albuterol (VENTOLIN HFA) 108 (90 Base) MCG/ACT inhaler 2 puff  2 puff Inhalation Q4H PRN Bing Neighbors, FNP        No past medical history on file.  No past surgical history on file.  Social History   Socioeconomic History  . Marital status: Married    Spouse name: Not on file  . Number of children: Not on file  . Years of education: Not on file  . Highest education level: Not on file  Occupational History  . Not on file  Tobacco Use  . Smoking status: Never Smoker  . Smokeless tobacco: Never Used  Substance and Sexual Activity  . Alcohol use: Never  . Drug use: Never  . Sexual activity: Not on file  Other Topics Concern  . Not on file  Social History Narrative   . Not on file   Social Determinants of Health   Financial Resource Strain: Not on file  Food Insecurity: Not on file  Transportation Needs: Not on file  Physical Activity: Not on file  Stress: Not on file  Social Connections: Not on file    No family history on file.  Review of Systems     Objective:  There were no vitals filed for this visit. There were no vitals filed for this visit. There is no height or weight on file to calculate BMI.  BP Readings from Last 3 Encounters:  03/02/20 104/62  02/28/20 132/76  02/26/20 135/85    Wt Readings from Last 3 Encounters:  02/28/20 296 lb (134.3 kg)  11/28/18 271 lb 6.4 oz (123.1 kg)  06/11/18 273 lb (123.8 kg)     Physical Exam Constitutional: He appears well-developed and well-nourished. No distress.  HENT:  Head: Normocephalic and atraumatic.  Right Ear: External ear normal.  Left Ear: External ear normal.  Mouth/Throat: Oropharynx is clear and moist.  Normal ear canals and TM b/l  Eyes: Conjunctivae and EOM are normal.  Neck: Neck supple. No tracheal deviation present. No thyromegaly present.  No carotid bruit  Cardiovascular: Normal rate, regular rhythm, normal heart sounds and intact distal pulses.   No murmur heard.  Pulmonary/Chest: Effort normal and breath sounds normal. No respiratory distress. He has no wheezes. He has no rales.  Abdominal: Soft. He exhibits no distension. There is no tenderness.  Genitourinary: deferred  Musculoskeletal: He exhibits no edema.  Lymphadenopathy:   He has no cervical adenopathy.  Skin: Skin is warm and dry. He is not diaphoretic.  Psychiatric: He has a normal mood and affect. His behavior is normal.         Assessment & Plan:   Physical exam: Screening blood work  ordered Immunizations   Exercise    Weight   Substance abuse     See Problem List for Assessment and Plan of chronic medical problems.   This visit occurred during the SARS-CoV-2 public health  emergency.  Safety protocols were in place, including screening questions prior to the visit, additional usage of staff PPE, and extensive cleaning of exam room while observing appropriate contact time as indicated for disinfecting solutions.  This encounter was created in error - please disregard.

## 2020-12-30 NOTE — Patient Instructions (Signed)
  Blood work was ordered.     Medications changes include :     Your prescription(s) have been submitted to your pharmacy. Please take as directed and contact our office if you believe you are having problem(s) with the medication(s).   A referral was ordered for        Someone from their office will call you to schedule an appointment.    Please followup in 6 months  

## 2020-12-31 ENCOUNTER — Other Ambulatory Visit: Payer: Self-pay

## 2020-12-31 ENCOUNTER — Ambulatory Visit (INDEPENDENT_AMBULATORY_CARE_PROVIDER_SITE_OTHER): Payer: 59 | Admitting: Internal Medicine

## 2020-12-31 ENCOUNTER — Encounter: Payer: Self-pay | Admitting: Internal Medicine

## 2020-12-31 ENCOUNTER — Encounter: Payer: 59 | Admitting: Internal Medicine

## 2020-12-31 VITALS — BP 126/82 | HR 72 | Temp 98.4°F | Ht 75.0 in | Wt 314.0 lb

## 2020-12-31 DIAGNOSIS — K219 Gastro-esophageal reflux disease without esophagitis: Secondary | ICD-10-CM | POA: Diagnosis not present

## 2020-12-31 DIAGNOSIS — Z0000 Encounter for general adult medical examination without abnormal findings: Secondary | ICD-10-CM

## 2020-12-31 DIAGNOSIS — Z23 Encounter for immunization: Secondary | ICD-10-CM

## 2020-12-31 DIAGNOSIS — E669 Obesity, unspecified: Secondary | ICD-10-CM | POA: Insufficient documentation

## 2020-12-31 DIAGNOSIS — R7989 Other specified abnormal findings of blood chemistry: Secondary | ICD-10-CM

## 2020-12-31 DIAGNOSIS — Z1159 Encounter for screening for other viral diseases: Secondary | ICD-10-CM

## 2020-12-31 DIAGNOSIS — I820 Budd-Chiari syndrome: Secondary | ICD-10-CM

## 2020-12-31 LAB — CBC WITH DIFFERENTIAL/PLATELET
Basophils Absolute: 0 10*3/uL (ref 0.0–0.1)
Basophils Relative: 0.8 % (ref 0.0–3.0)
Eosinophils Absolute: 0.1 10*3/uL (ref 0.0–0.7)
Eosinophils Relative: 1.8 % (ref 0.0–5.0)
HCT: 47.5 % (ref 39.0–52.0)
Hemoglobin: 16.1 g/dL (ref 13.0–17.0)
Lymphocytes Relative: 25.6 % (ref 12.0–46.0)
Lymphs Abs: 1.6 10*3/uL (ref 0.7–4.0)
MCHC: 34 g/dL (ref 30.0–36.0)
MCV: 83.3 fl (ref 78.0–100.0)
Monocytes Absolute: 0.6 10*3/uL (ref 0.1–1.0)
Monocytes Relative: 9.3 % (ref 3.0–12.0)
Neutro Abs: 3.8 10*3/uL (ref 1.4–7.7)
Neutrophils Relative %: 62.5 % (ref 43.0–77.0)
Platelets: 254 10*3/uL (ref 150.0–400.0)
RBC: 5.7 Mil/uL (ref 4.22–5.81)
RDW: 13.6 % (ref 11.5–15.5)
WBC: 6.1 10*3/uL (ref 4.0–10.5)

## 2020-12-31 LAB — LIPID PANEL
Cholesterol: 166 mg/dL (ref 0–200)
HDL: 47.8 mg/dL (ref >39.00)
LDL Cholesterol: 86 mg/dL (ref 0–99)
NonHDL: 118.57
Total CHOL/HDL Ratio: 3
Triglycerides: 163 mg/dL — ABNORMAL HIGH (ref 0.0–149.0)
VLDL: 32.6 mg/dL (ref 0.0–40.0)

## 2020-12-31 LAB — COMPREHENSIVE METABOLIC PANEL
ALT: 28 U/L (ref 0–53)
AST: 27 U/L (ref 0–37)
Albumin: 4.6 g/dL (ref 3.5–5.2)
Alkaline Phosphatase: 70 U/L (ref 39–117)
BUN: 16 mg/dL (ref 6–23)
CO2: 30 mEq/L (ref 19–32)
Calcium: 9.9 mg/dL (ref 8.4–10.5)
Chloride: 101 mEq/L (ref 96–112)
Creatinine, Ser: 1.1 mg/dL (ref 0.40–1.50)
GFR: 88.25 mL/min (ref >60.00)
Glucose, Bld: 95 mg/dL (ref 70–99)
Potassium: 4.4 mEq/L (ref 3.5–5.1)
Sodium: 138 mEq/L (ref 135–145)
Total Bilirubin: 0.6 mg/dL (ref 0.2–1.2)
Total Protein: 7.8 g/dL (ref 6.0–8.3)

## 2020-12-31 LAB — TSH: TSH: 2.3 u[IU]/mL (ref 0.35–4.50)

## 2020-12-31 MED ORDER — PHENTERMINE HCL 37.5 MG PO CAPS: 37.5000 mg | ORAL_CAPSULE | ORAL | 2 refills | Status: AC

## 2020-12-31 NOTE — Assessment & Plan Note (Signed)
Chronic Congenital Asymptomatic No longer seeing neurology No treatment

## 2020-12-31 NOTE — Addendum Note (Signed)
Addended by: Karma Ganja on: 12/31/2020 09:26 AM   Modules accepted: Orders

## 2020-12-31 NOTE — Assessment & Plan Note (Signed)
Discussed importance of weight loss Stressed regular exercise - goal 30 minutes 5 days a week Discussed decreasing portions, improving diet/snacks He has been on phentermine in the past and it helped - he had no side effects We can try phentermine again x 3 months - he will have his wife check his BP at home Stressed diet changes to make this work

## 2020-12-31 NOTE — Patient Instructions (Addendum)
Blood work was ordered.     Flu immunization administered today.   Medications changes include :   Phentermine 37.5 mg daily for three months  Your prescription(s) have been submitted to your pharmacy.    Please followup in 1 year   Health Maintenance, Male Adopting a healthy lifestyle and getting preventive care are important in promoting health and wellness. Ask your health care provider about:  The right schedule for you to have regular tests and exams.  Things you can do on your own to prevent diseases and keep yourself healthy. What should I know about diet, weight, and exercise? Eat a healthy diet   Eat a diet that includes plenty of vegetables, fruits, low-fat dairy products, and lean protein.  Do not eat a lot of foods that are high in solid fats, added sugars, or sodium. Maintain a healthy weight Body mass index (BMI) is a measurement that can be used to identify possible weight problems. It estimates body fat based on height and weight. Your health care provider can help determine your BMI and help you achieve or maintain a healthy weight. Get regular exercise Get regular exercise. This is one of the most important things you can do for your health. Most adults should:  Exercise for at least 150 minutes each week. The exercise should increase your heart rate and make you sweat (moderate-intensity exercise).  Do strengthening exercises at least twice a week. This is in addition to the moderate-intensity exercise.  Spend less time sitting. Even light physical activity can be beneficial. Watch cholesterol and blood lipids Have your blood tested for lipids and cholesterol at 34 years of age, then have this test every 5 years. You may need to have your cholesterol levels checked more often if:  Your lipid or cholesterol levels are high.  You are older than 34 years of age.  You are at high risk for heart disease. What should I know about cancer screening? Many  types of cancers can be detected early and may often be prevented. Depending on your health history and family history, you may need to have cancer screening at various ages. This may include screening for:  Colorectal cancer.  Prostate cancer.  Skin cancer.  Lung cancer. What should I know about heart disease, diabetes, and high blood pressure? Blood pressure and heart disease  High blood pressure causes heart disease and increases the risk of stroke. This is more likely to develop in people who have high blood pressure readings, are of African descent, or are overweight.  Talk with your health care provider about your target blood pressure readings.  Have your blood pressure checked: ? Every 3-5 years if you are 78-87 years of age. ? Every year if you are 47 years old or older.  If you are between the ages of 46 and 66 and are a current or former smoker, ask your health care provider if you should have a one-time screening for abdominal aortic aneurysm (AAA). Diabetes Have regular diabetes screenings. This checks your fasting blood sugar level. Have the screening done:  Once every three years after age 24 if you are at a normal weight and have a low risk for diabetes.  More often and at a younger age if you are overweight or have a high risk for diabetes. What should I know about preventing infection? Hepatitis B If you have a higher risk for hepatitis B, you should be screened for this virus. Talk with your health care provider  to find out if you are at risk for hepatitis B infection. Hepatitis C Blood testing is recommended for:  Everyone born from [birth year] through 1964.  Anyone with known risk factors for hepatitis C. Sexually transmitted infections (STIs)  You should be screened each year for STIs, including gonorrhea and chlamydia, if: ? You are sexually active and are younger than 34 years of age. ? You are older than 34 years of age and your health care provider tells you  that you are at risk for this type of infection. ? Your sexual activity has changed since you were last screened, and you are at increased risk for chlamydia or gonorrhea. Ask your health care provider if you are at risk.  Ask your health care provider about whether you are at high risk for HIV. Your health care provider may recommend a prescription medicine to help prevent HIV infection. If you choose to take medicine to prevent HIV, you should first get tested for HIV. You should then be tested every 3 months for as long as you are taking the medicine. Follow these instructions at home: Lifestyle  Do not use any products that contain nicotine or tobacco, such as cigarettes, e-cigarettes, and chewing tobacco. If you need help quitting, ask your health care provider.  Do not use street drugs.  Do not share needles.  Ask your health care provider for help if you need support or information about quitting drugs. Alcohol use  Do not drink alcohol if your health care provider tells you not to drink.  If you drink alcohol: ? Limit how much you have to 0-2 drinks a day. ? Be aware of how much alcohol is in your drink. In the [place] one drink equals one 12 oz bottle of beer (355 mL), one 5 oz glass of wine (148 mL), or one 1 oz glass of hard liquor (44 mL). General instructions  Schedule regular health, dental, and eye exams.  Stay current with your vaccines.  Tell your health care provider if: ? You often feel depressed. ? You have ever been abused or do not feel safe at home. Summary  Adopting a healthy lifestyle and getting preventive care are important in promoting health and wellness.  Follow your health care provider's instructions about healthy diet, exercising, and getting tested or screened for diseases.  Follow your health care provider's instructions on monitoring your cholesterol and blood pressure. This information is not intended to replace advice given to you by your  health care provider. Make sure you discuss any questions you have with your health care provider. Document Revised: 12/05/2018 Document Reviewed: 12/05/2018 Elsevier Patient Education  2020 [employer].

## 2020-12-31 NOTE — Progress Notes (Addendum)
Subjective:    Patient ID: Phillip Sheppard, male    DOB: [birth year], 35 y.o.   MRN: [id number]  HPI  He is here to establish with a new pcp.   He is here for a physical exam.   Had blood work when he had covid and pna and his LFTS were elevated.  Needs Hep c rechecked.    He would like to do phentermine for weight loss.    Medications and allergies reviewed with patient and updated if appropriate.  Patient Active Problem List   Diagnosis Date Noted  . GERD (gastroesophageal reflux disease) 12/31/2020  . Obesity (BMI 30-39.9) 12/31/2020  . 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 MODERNA COVID-19 VACCINE 100 MCG/0.5ML injection      Current Facility-Administered Medications on File Prior to Visit  Medication Dose Route Frequency Provider Last Rate Last Admin  . albuterol (VENTOLIN HFA) 108 (90 Base) MCG/ACT inhaler 2 puff  2 puff Inhalation Q4H PRN Bing Neighbors, FNP        History reviewed. No pertinent past medical history.  History reviewed. No pertinent surgical history.  Social History   Socioeconomic History  . Marital status: Married    Spouse name: Not on file  . Number of children: Not on file  . Years of education: Not on file  . Highest education level: Not on file  Occupational History  . Not on file  Tobacco Use  . Smoking status: Never Smoker  . Smokeless tobacco: Never Used  Substance and Sexual Activity  . Alcohol use: Never  . Drug use: Never  . Sexual activity: Not on file  Other Topics Concern  . Not on file  Social History Narrative  . Not on file   Social Determinants of Health   Financial Resource Strain: Not on file  Food Insecurity: Not on file  Transportation Needs: Not on file  Physical Activity: Not on file  Stress: Not on file  Social Connections: Not on file    History reviewed. No pertinent family history.  Review of Systems   Constitutional: Negative for chills and fever.  Eyes: Negative for visual disturbance.  Respiratory: Positive for cough (dry intermittent cough since covid - has improved). Negative for shortness of breath and wheezing.   Cardiovascular: Negative for chest pain, palpitations and leg swelling.  Gastrointestinal: Negative for abdominal pain, blood in stool, constipation, diarrhea and nausea.       Genella Rife - every other day  Genitourinary: Negative for difficulty urinating, dysuria and hematuria.  Musculoskeletal: Negative for arthralgias and back pain.  Skin: Negative for rash.  Neurological: Negative for light-headedness, numbness and headaches.  Psychiatric/Behavioral: Negative for dysphoric mood. The patient is not nervous/anxious.        Objective:   Vitals:   12/31/20 0833  BP: 126/82  Pulse: 72  Temp: 98.4 F (36.9 C)  SpO2: 98%   Filed Weights   12/31/20 0833  Weight: (!) 314 lb (142.4 kg)   Body mass index is 39.25 kg/m.  BP Readings from Last 3 Encounters:  12/31/20 126/82  03/02/20 104/62  02/28/20 132/76    Wt Readings from Last 3 Encounters:  12/31/20 (!) 314 lb (142.4 kg)  02/28/20 296 lb (134.3 kg)  11/28/18 271 lb 6.4 oz (123.1 kg)    Depression screen [facility] 2/9 12/31/2020 11/28/2018 06/11/2018  Decreased Interest 0 0 0  Down, Depressed, Hopeless  0 0 0  PHQ - 2 Score 0 0 0  Altered sleeping 0 0 0  Tired, decreased energy 0 0 0  Change in appetite 0 0 0  Feeling bad or failure about yourself  0 0 0  Trouble concentrating 0 0 0  Moving slowly or fidgety/restless 0 0 0  Suicidal thoughts 0 0 0  PHQ-9 Score 0 0 0  Difficult doing work/chores Not difficult at all - Not difficult at all      Physical Exam Constitutional: He appears well-developed and well-nourished. No distress.  HENT:  Head: Normocephalic and atraumatic.  Right Ear: External ear normal.  Left Ear: External ear normal.  Mouth/Throat: Oropharynx is clear and moist.  Normal ear canals  and TM b/l  Eyes: Conjunctivae and EOM are normal.  Neck: Neck supple. No tracheal deviation present. No thyromegaly present.  No carotid bruit  Cardiovascular: Normal rate, regular rhythm, normal heart sounds and intact distal pulses.   No murmur heard. Pulmonary/Chest: Effort normal and breath sounds normal. No respiratory distress. He has no wheezes. He has no rales.  Abdominal: Soft. He exhibits no distension. There is no tenderness.  Genitourinary: deferred  Musculoskeletal: He exhibits no edema.  Lymphadenopathy:   He has no cervical adenopathy.  Skin: Skin is warm and dry. He is not diaphoretic.  Psychiatric: He has a normal mood and affect. His behavior is normal.         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  No evidence of depression.    See Problem List for Assessment and Plan of chronic medical problems.   This visit occurred during the SARS-CoV-2 public health emergency.  Safety protocols were in place, including screening questions prior to the visit, additional usage of staff PPE, and extensive cleaning of exam room while observing appropriate contact time as indicated for disinfecting solutions.

## 2020-12-31 NOTE — Assessment & Plan Note (Signed)
Chronic Symptomatic every other day Working on diet changes and will work on weight loss Would like to avoid medication Advised to take pepcid otc if needed for a short period  Stressed weight loss, lifestyle changes

## 2021-01-04 LAB — HEPATITIS C ANTIBODY
Hepatitis C Ab: NONREACTIVE
SIGNAL TO CUT-OFF: 0.93 (ref <1.00)

## 2021-06-11 IMAGING — DX DG CHEST 2V PORT
2 series · 2 of 2 positions shown · non-contrast
Comparison: None.

CLINICAL DATA: Cough and COVID exposure

EXAM:
CHEST  2 VIEW PORTABLE

[x chest pa grid]
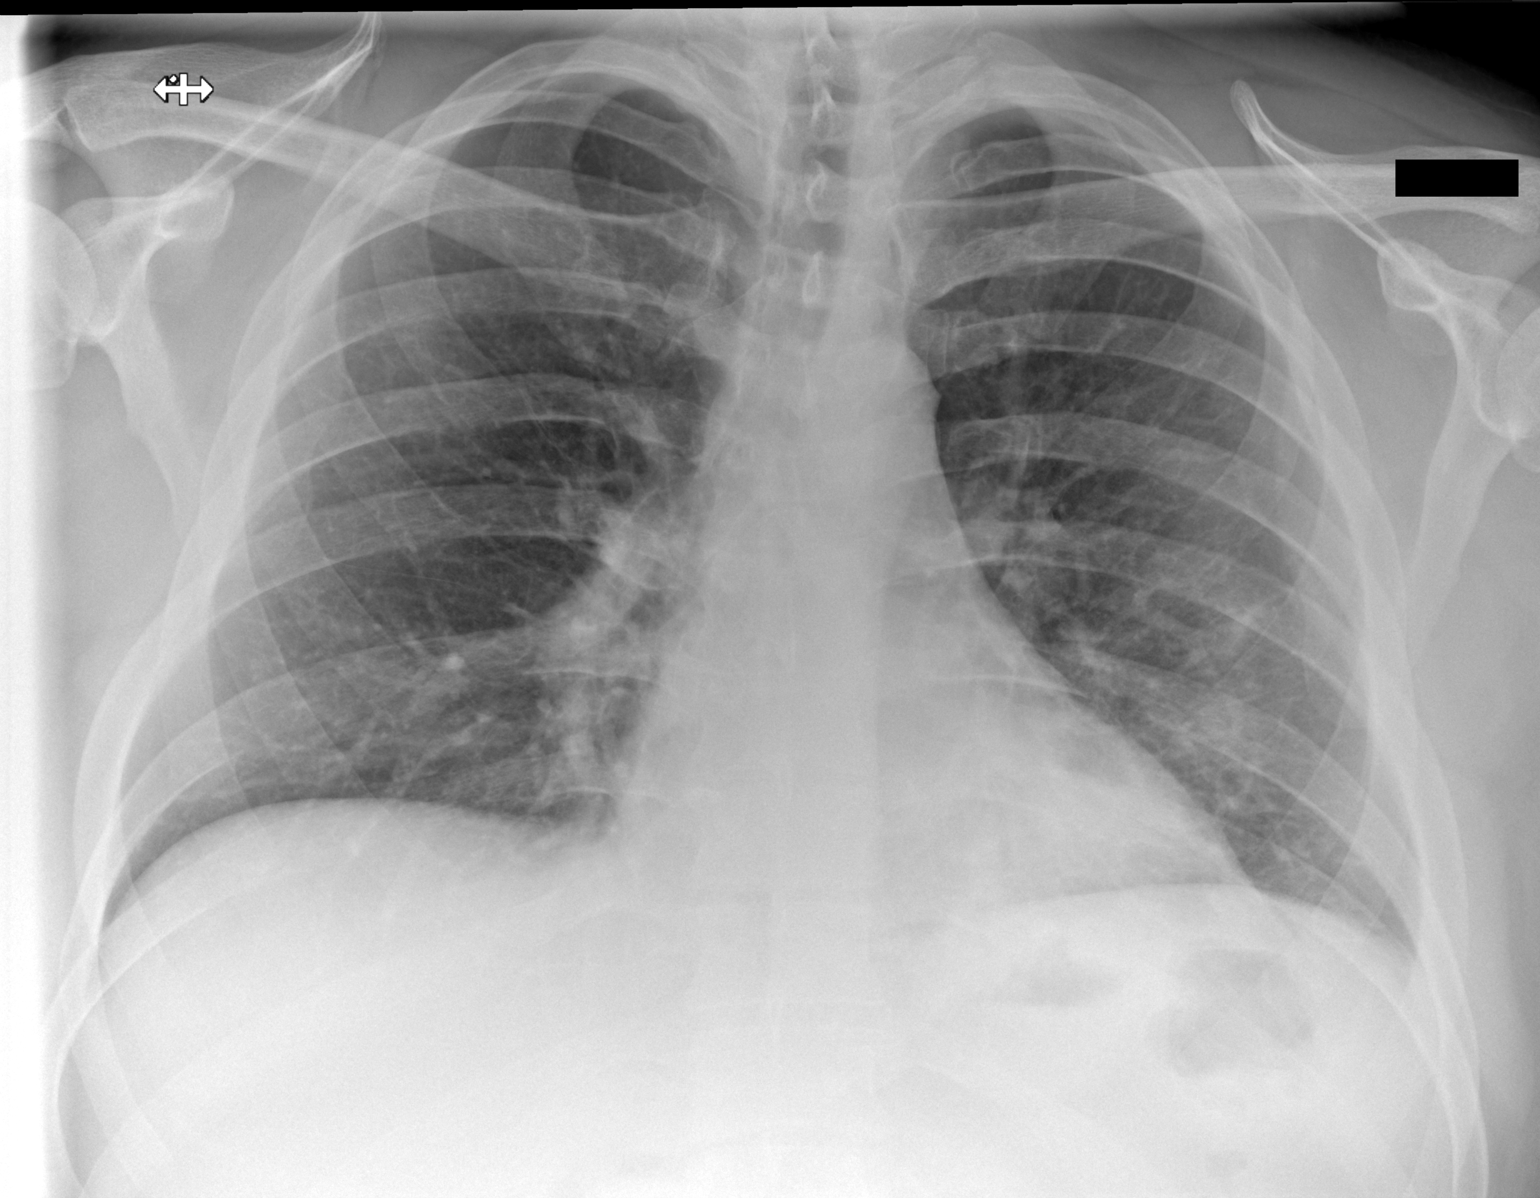

[x chest lat grid]
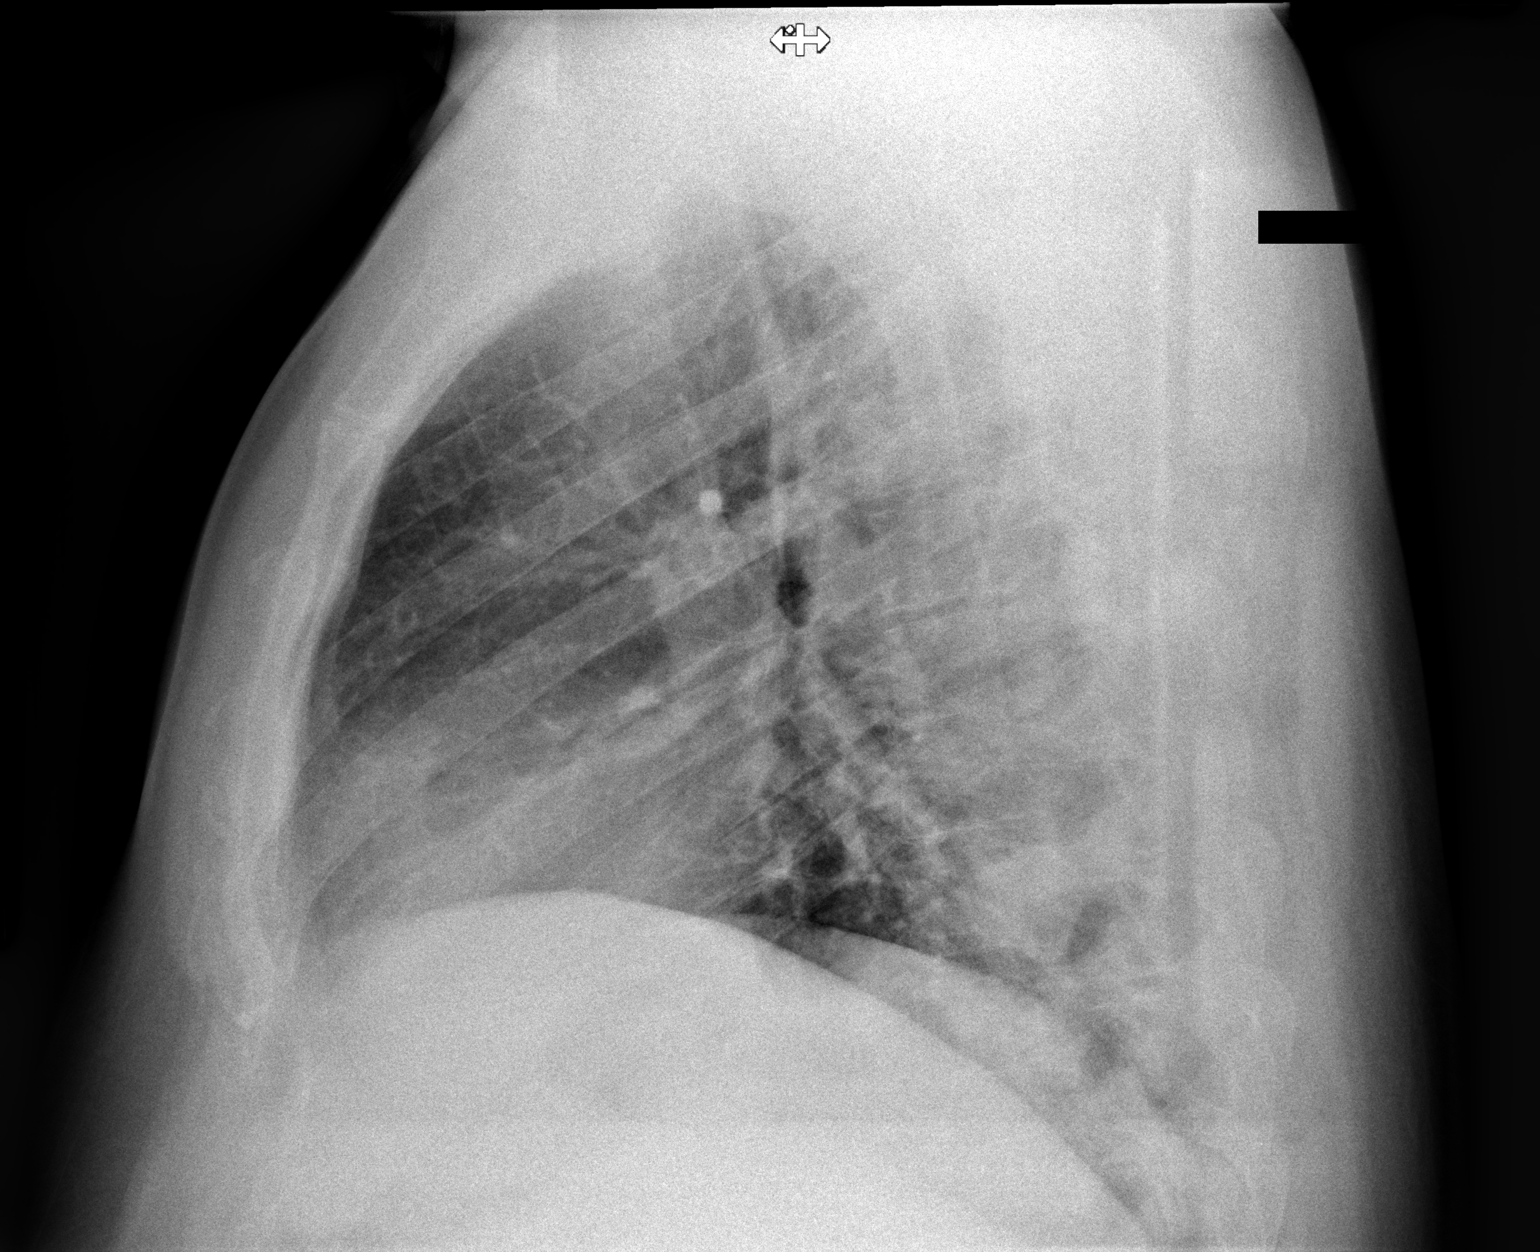

[2 of 2 positions shown; findings below may reference images not displayed]

FINDINGS: Left vague perihilar airspace opacity. No pleural effusion. Normal
heart size. No pneumothorax.
IMPRESSION: Vague left perihilar airspace opacity consistent with pneumonia,
potentially atypical or viral pneumonia.

## 2021-09-12 ENCOUNTER — Telehealth: Payer: 59 | Admitting: Emergency Medicine

## 2021-09-12 DIAGNOSIS — J329 Chronic sinusitis, unspecified: Secondary | ICD-10-CM

## 2021-09-12 DIAGNOSIS — B9689 Other specified bacterial agents as the cause of diseases classified elsewhere: Secondary | ICD-10-CM | POA: Diagnosis not present

## 2021-09-12 MED ORDER — AMOXICILLIN-POT CLAVULANATE 875-125 MG PO TABS: 1.0000 | ORAL_TABLET | Freq: Two times a day (BID) | ORAL | 0 refills | Status: AC

## 2021-09-12 NOTE — Progress Notes (Signed)
E-Visit for Sinus Problems  We are sorry that you are not feeling well.  Here is how we plan to help!  Based on what you have shared with me it looks like you have sinusitis.  Sinusitis is inflammation and infection in the sinus cavities of the head.  Based on your presentation I believe you most likely have Acute Bacterial Sinusitis.  This is an infection caused by bacteria and is treated with antibiotics. I have prescribed Augmentin 875mg /125mg  one tablet twice daily with food, for 7 days. You may use an oral decongestant such as Mucinex D or if you have glaucoma or high blood pressure use plain Mucinex. Saline nasal spray help and can safely be used as often as needed for congestion.  If you develop worsening sinus pain, fever or notice severe headache and vision changes, or if symptoms are not better after completion of antibiotic, please schedule an appointment with a health care provider.    Sinus infections are not as easily transmitted as other respiratory infection, however we still recommend that you avoid close contact with loved ones, especially the very young and elderly.  Remember to wash your hands thoroughly throughout the day as this is the number one way to prevent the spread of infection!  Home Care: Only take medications as instructed by your medical team. Complete the entire course of an antibiotic. Do not take these medications with alcohol. You can place a towel over your head and breathe in the steam from hot water coming from a faucet. Avoid close contacts especially the very young and the elderly. Cover your mouth when you cough or sneeze. Always remember to wash your hands.  Get Help Right Away If: You develop worsening fever or sinus pain. You develop a severe head ache or visual changes. Your symptoms persist after you have completed your treatment plan.  Make sure you Understand these instructions. Will watch your condition. Will get help right away if you are  not doing well or get worse.  Thank you for choosing an e-visit.  Your e-visit answers were reviewed by a board certified advanced clinical practitioner to complete your personal care plan. Depending upon the condition, your plan could have included both over the counter or prescription medications.  Please review your pharmacy choice. Make sure the pharmacy is open so you can pick up prescription now. If there is a problem, you may contact your provider through and have the prescription routed to another pharmacy.  Your safety is important to [employer]. If you have drug allergies check your prescription carefully.   For the next 24 hours you can use MyChart to ask questions about today's visit, request a non-urgent call back, or ask for a work or school excuse. You will get an email in the next two days asking about your experience. I hope that your e-visit has been valuable and will speed your recovery.  I have spent 5 minutes in review of e-visit questionnaire, review and updating patient chart, medical decision making and response to patient.   [place], PhD, FNP-BC

## 2021-10-24 ENCOUNTER — Telehealth: Payer: 59 | Admitting: Family Medicine

## 2021-10-24 DIAGNOSIS — H66001 Acute suppurative otitis media without spontaneous rupture of ear drum, right ear: Secondary | ICD-10-CM | POA: Diagnosis not present

## 2021-10-24 DIAGNOSIS — J069 Acute upper respiratory infection, unspecified: Secondary | ICD-10-CM | POA: Diagnosis not present

## 2021-10-25 MED ORDER — PROMETHAZINE-DM 6.25-15 MG/5ML PO SYRP
2.5000 mL | ORAL_SOLUTION | Freq: Three times a day (TID) | ORAL | 0 refills | Status: AC | PRN
Start: 2021-10-25 — End: ?

## 2021-10-25 MED ORDER — FLUTICASONE PROPIONATE 50 MCG/ACT NA SUSP
2.0000 | Freq: Every day | NASAL | 6 refills | Status: AC
Start: 2021-10-25 — End: ?

## 2021-10-25 NOTE — Progress Notes (Signed)

## 2021-10-26 MED ORDER — AMOXICILLIN 875 MG PO TABS: 875.0000 mg | ORAL_TABLET | Freq: Two times a day (BID) | ORAL | 0 refills | Status: AC

## 2021-10-26 NOTE — Addendum Note (Signed)
Addended by: Waldon Merl on: 10/26/2021 10:22 AM   Modules accepted: Orders

## 2021-10-29 DIAGNOSIS — J069 Acute upper respiratory infection, unspecified: Secondary | ICD-10-CM | POA: Diagnosis not present

## 2021-10-29 DIAGNOSIS — H6691 Otitis media, unspecified, right ear: Secondary | ICD-10-CM | POA: Diagnosis not present

## 2021-11-03 ENCOUNTER — Encounter: Payer: Self-pay | Admitting: Internal Medicine

## 2021-12-08 ENCOUNTER — Telehealth: Payer: 59 | Admitting: Physician Assistant

## 2021-12-08 DIAGNOSIS — J111 Influenza due to unidentified influenza virus with other respiratory manifestations: Secondary | ICD-10-CM | POA: Diagnosis not present

## 2021-12-08 MED ORDER — OSELTAMIVIR PHOSPHATE 75 MG PO CAPS: 75.0000 mg | ORAL_CAPSULE | Freq: Two times a day (BID) | ORAL | 0 refills | Status: AC

## 2021-12-08 NOTE — Progress Notes (Signed)
E visit for Flu like symptoms   We are sorry that you are not feeling well.  Here is how we plan to help! Based on what you have shared with me it looks like you may have a respiratory virus that may be influenza.  Influenza or "the flu" is   an infection caused by a respiratory virus. The flu virus is highly contagious and persons who did not receive their yearly flu vaccination may "catch" the flu from close contact.  We have anti-viral medications to treat the viruses that cause this infection. They are not a "cure" and only shorten the course of the infection. These prescriptions are most effective when they are given within the first 2 days of "flu" symptoms. Antiviral medication are indicated if you have a high risk of complications from the flu. You should  also consider an antiviral medication if you are in close contact with someone who is at risk. These medications can help patients avoid complications from the flu  but have side effects that you should know. Possible side effects from Tamiflu or oseltamivir include nausea, vomiting, diarrhea, dizziness, headaches, eye redness, sleep problems or other respiratory symptoms. You should not take Tamiflu if you have an allergy to oseltamivir or any to the ingredients in Tamiflu.  Based upon your symptoms and potential risk factors I have prescribed Oseltamivir (Tamiflu).  It has been sent to your designated pharmacy.  You will take one 75 mg capsule orally twice a day for the next 5 days.  ANYONE WHO HAS FLU SYMPTOMS SHOULD: Stay home. The flu is highly contagious and going out or to work exposes others! Be sure to drink plenty of fluids. Water is fine as well as fruit juices, sodas and electrolyte beverages. You may want to stay away from caffeine or alcohol. If you are nauseated, try taking small sips of liquids. How do you know if you are getting enough fluid? Your urine should be a pale yellow or almost colorless. Get rest. Taking a steamy  shower or using a humidifier may help nasal congestion and ease sore throat pain. Using a saline nasal spray works much the same way. Cough drops, hard candies and sore throat lozenges may ease your cough. Line up a caregiver. Have someone check on you regularly.   GET HELP RIGHT AWAY IF: You cannot keep down liquids or your medications. You become short of breath Your fell like you are going to pass out or loose consciousness. Your symptoms persist after you have completed your treatment plan MAKE SURE YOU  Understand these instructions. Will watch your condition. Will get help right away if you are not doing well or get worse.  Your e-visit answers were reviewed by a board certified advanced clinical practitioner to complete your personal care plan.  Depending on the condition, your plan could have included both over the counter or prescription medications.  If there is a problem please reply  once you have received a response from your provider.  Your safety is important to us.  If you have drug allergies check your prescription carefully.    You can use MyChart to ask questions about today's visit, request a non-urgent call back, or ask for a work or school excuse for 24 hours related to this e-Visit. If it has been greater than 24 hours you will need to follow up with your provider, or enter a new e-Visit to address those concerns.  You will get an e-mail in the next   two days asking about your experience.  I hope that your e-visit has been valuable and will speed your recovery. Thank you for using e-visits.  I provided 5 minutes of non face-to-face time during this encounter for chart review and documentation.
